# Patient Record
Sex: Female | Born: 1955 | Race: White | Hispanic: No | Smoking: Current every day smoker
Health system: Southern US, Community
[De-identification: ages and names within clinical notes are randomized; demographics above are authoritative.]

## PROBLEM LIST (undated history)

## (undated) DIAGNOSIS — E079 Disorder of thyroid, unspecified: Secondary | ICD-10-CM

## (undated) DIAGNOSIS — F329 Major depressive disorder, single episode, unspecified: Secondary | ICD-10-CM

## (undated) DIAGNOSIS — E039 Hypothyroidism, unspecified: Secondary | ICD-10-CM

## (undated) DIAGNOSIS — I10 Essential (primary) hypertension: Secondary | ICD-10-CM

## (undated) DIAGNOSIS — F419 Anxiety disorder, unspecified: Secondary | ICD-10-CM

## (undated) DIAGNOSIS — F1910 Other psychoactive substance abuse, uncomplicated: Secondary | ICD-10-CM

## (undated) DIAGNOSIS — C801 Malignant (primary) neoplasm, unspecified: Secondary | ICD-10-CM

## (undated) DIAGNOSIS — F32A Depression, unspecified: Secondary | ICD-10-CM

## (undated) DIAGNOSIS — C9000 Multiple myeloma not having achieved remission: Secondary | ICD-10-CM

## (undated) DIAGNOSIS — E785 Hyperlipidemia, unspecified: Secondary | ICD-10-CM

## (undated) HISTORY — DX: Major depressive disorder, single episode, unspecified: F32.9

## (undated) HISTORY — DX: Depression, unspecified: F32.A

## (undated) HISTORY — PX: BACK SURGERY: SHX140

## (undated) HISTORY — DX: Other psychoactive substance abuse, uncomplicated: F19.10

## (undated) HISTORY — DX: Anxiety disorder, unspecified: F41.9

---

## 1998-04-30 ENCOUNTER — Ambulatory Visit (HOSPITAL_COMMUNITY): Admission: RE | Admit: 1998-04-30 | Discharge: 1998-04-30 | Payer: Self-pay | Admitting: Orthopedic Surgery

## 1998-05-14 ENCOUNTER — Ambulatory Visit (HOSPITAL_COMMUNITY): Admission: RE | Admit: 1998-05-14 | Discharge: 1998-05-14 | Payer: Self-pay | Admitting: Orthopedic Surgery

## 1998-05-28 ENCOUNTER — Ambulatory Visit (HOSPITAL_COMMUNITY): Admission: RE | Admit: 1998-05-28 | Discharge: 1998-05-28 | Payer: Self-pay | Admitting: Orthopedic Surgery

## 1998-06-12 ENCOUNTER — Encounter: Payer: Self-pay | Admitting: Neurosurgery

## 1998-06-13 ENCOUNTER — Inpatient Hospital Stay (HOSPITAL_COMMUNITY): Admission: RE | Admit: 1998-06-13 | Discharge: 1998-06-14 | Payer: Self-pay | Admitting: Neurosurgery

## 1998-06-13 ENCOUNTER — Encounter: Payer: Self-pay | Admitting: Neurosurgery

## 1999-11-27 ENCOUNTER — Encounter: Admission: RE | Admit: 1999-11-27 | Discharge: 1999-11-27 | Payer: Self-pay | Admitting: Family Medicine

## 1999-11-27 ENCOUNTER — Encounter: Payer: Self-pay | Admitting: Family Medicine

## 2001-05-05 ENCOUNTER — Other Ambulatory Visit: Admission: RE | Admit: 2001-05-05 | Discharge: 2001-05-05 | Payer: Self-pay | Admitting: Obstetrics and Gynecology

## 2004-03-21 ENCOUNTER — Other Ambulatory Visit: Admission: RE | Admit: 2004-03-21 | Discharge: 2004-03-21 | Payer: Self-pay | Admitting: Obstetrics and Gynecology

## 2005-08-21 ENCOUNTER — Other Ambulatory Visit: Admission: RE | Admit: 2005-08-21 | Discharge: 2005-08-21 | Payer: Self-pay | Admitting: Obstetrics and Gynecology

## 2012-02-21 ENCOUNTER — Emergency Department (HOSPITAL_COMMUNITY): Payer: Managed Care, Other (non HMO)

## 2012-02-21 ENCOUNTER — Emergency Department (HOSPITAL_COMMUNITY)
Admission: EM | Admit: 2012-02-21 | Discharge: 2012-02-21 | Disposition: A | Discharge status: Home / Self Care | Payer: Managed Care, Other (non HMO) | Attending: Emergency Medicine | Admitting: Emergency Medicine

## 2012-02-21 ENCOUNTER — Encounter (HOSPITAL_COMMUNITY): Payer: Self-pay | Admitting: *Deleted

## 2012-02-21 DIAGNOSIS — E079 Disorder of thyroid, unspecified: Secondary | ICD-10-CM | POA: Insufficient documentation

## 2012-02-21 DIAGNOSIS — F410 Panic disorder [episodic paroxysmal anxiety] without agoraphobia: Secondary | ICD-10-CM | POA: Insufficient documentation

## 2012-02-21 DIAGNOSIS — R002 Palpitations: Secondary | ICD-10-CM | POA: Insufficient documentation

## 2012-02-21 HISTORY — DX: Disorder of thyroid, unspecified: E07.9

## 2012-02-21 HISTORY — DX: Essential (primary) hypertension: I10

## 2012-02-21 LAB — DIFFERENTIAL
Basophils Relative: 0 % (ref 0–1)
Eosinophils Relative: 0 % (ref 0–5)
Monocytes Absolute: 0.7 10*3/uL (ref 0.1–1.0)
Monocytes Relative: 8 % (ref 3–12)
Neutro Abs: 5.4 10*3/uL (ref 1.7–7.7)

## 2012-02-21 LAB — CBC
HCT: 38.9 % (ref 36.0–46.0)
Hemoglobin: 13.7 g/dL (ref 12.0–15.0)
MCH: 33.2 pg (ref 26.0–34.0)
MCHC: 35.2 g/dL (ref 30.0–36.0)
MCV: 94.2 fL (ref 78.0–100.0)

## 2012-02-21 LAB — BASIC METABOLIC PANEL
BUN: 13 mg/dL (ref 6–23)
CO2: 24 mEq/L (ref 19–32)
Chloride: 102 mEq/L (ref 96–112)
Creatinine, Ser: 0.65 mg/dL (ref 0.50–1.10)
GFR calc Af Amer: >90 mL/min (ref >90)

## 2012-02-21 MED ORDER — LORAZEPAM 1 MG PO TABS
1.0000 mg | ORAL_TABLET | Freq: Once | ORAL | Status: AC
  Administered 2012-02-21: 1 mg via ORAL
  Filled 2012-02-21: qty 1

## 2012-02-21 MED ORDER — LORAZEPAM 1 MG PO TABS: 1.0000 mg | ORAL_TABLET | Freq: Three times a day (TID) | ORAL | Status: AC | PRN

## 2012-02-21 NOTE — Discharge Instructions (Signed)

## 2012-02-21 NOTE — ED Notes (Signed)
To ED for eval of feeling like her heart is racing. States she isn't feeling normal. Unable to find her thoughts. States she feels delayed. No neuro deficits noted. Denies difficulty with urination. No vomiting. Intermittent HA. States she can feel her heart beating in her throat.

## 2012-02-21 NOTE — ED Provider Notes (Addendum)
History     CSN: [id number]  Arrival date & time 02/21/12  1320   First MD Initiated Contact with Patient 02/21/12 1359      Chief Complaint  Patient presents with  . Palpitations    (Consider location/radiation/quality/duration/timing/severity/associated sxs/prior treatment) Patient is a 56 y.o. female presenting with palpitations. The history is provided by the patient.  Palpitations  This is a recurrent problem. The current episode started 1 to 2 hours ago. The problem occurs constantly. The problem has been gradually improving. The problem is associated with fear and anxiety. Associated symptoms include shortness of breath. Pertinent negatives include no diaphoresis, no fever, no numbness, no chest pain, no exertional chest pressure, no irregular heartbeat, no nausea, no vomiting, no headaches, no leg pain, no weakness and no cough. Associated symptoms comments: States she feels disoriented and palpatations. She has tried nothing for the symptoms. The treatment provided no relief. Risk factors include smoking/tobacco exposure. Her past medical history does not include hyperthyroidism. Past medical history comments: hypothyroidism.    Past Medical History  Diagnosis Date  . Thyroid disease   . Hypertension     Past Surgical History  Procedure Date  . Back surgery     History reviewed. No pertinent family history.  History  Substance Use Topics  . Smoking status: Current Everyday Smoker -- 0.5 packs/day    Types: Cigarettes  . Smokeless tobacco: Not on file  . Alcohol Use: Yes    OB History    Grav Para Term Preterm Abortions TAB SAB Ect Mult Living                  Review of Systems  Constitutional: Negative for fever and diaphoresis.  Respiratory: Positive for shortness of breath. Negative for cough.   Cardiovascular: Positive for palpitations. Negative for chest pain.  Gastrointestinal: Negative for nausea and vomiting.  Neurological: Negative for weakness,  numbness and headaches.  All other systems reviewed and are negative.    Allergies  Review of patient's allergies indicates no known allergies.  Home Medications   Current Outpatient Rx  Name Route Sig Dispense Refill  . CO Q 10 PO Oral Take 1 capsule by mouth every morning.    Marland Kitchen ALERTAB PO Oral Take 1 capsule by mouth daily.    Marland Kitchen ADVIL PM PO Oral Take 2 tablets by mouth at bedtime.    Marland Kitchen LEVOTHYROXINE SODIUM 125 MCG PO TABS Oral Take 125 mcg by mouth daily.    Marland Kitchen METOPROLOL TARTRATE 50 MG PO TABS Oral Take 50 mg by mouth 2 (two) times daily.    Marland Kitchen OVER THE COUNTER MEDICATION Oral Take 1 tablet by mouth daily. Psycles Female Nutrient Support System    . VENLAFAXINE HCL 75 MG PO TABS Oral Take 150 mg by mouth 2 (two) times daily.      BP 143/73  Pulse 77  Temp(Src) 97.9 F (36.6 C) (Oral)  Resp 16  SpO2 100%  Physical Exam  Nursing note and vitals reviewed. Constitutional: She is oriented to person, place, and time. She appears well-developed and well-nourished. She appears distressed.       Tearful on exam  HENT:  Head: Normocephalic and atraumatic.  Mouth/Throat: Oropharynx is clear and moist.  Eyes: Conjunctivae and EOM are normal. Pupils are equal, round, and reactive to light.  Neck: Normal range of motion. Neck supple.  Cardiovascular: Normal rate, regular rhythm and intact distal pulses.   No murmur heard. Pulmonary/Chest: Effort normal and breath sounds  normal. No respiratory distress. She has no wheezes. She has no rales.  Abdominal: Soft. She exhibits no distension. There is no tenderness. There is no rebound and no guarding.  Musculoskeletal: Normal range of motion. She exhibits no edema and no tenderness.  Neurological: She is alert and oriented to person, place, and time.  Skin: Skin is warm and dry. No rash noted. No erythema.  Psychiatric: Her behavior is normal. Her mood appears anxious.    ED Course  Procedures (including critical care time)   Labs  Reviewed  CBC  DIFFERENTIAL  BASIC METABOLIC PANEL  TSH  T4, FREE  LAB REPORT - SCANNED   Dg Chest 2 View  02/21/2012  *RADIOLOGY REPORT*  Clinical Data: Tachycardia.  Smoker.  CHEST - 2 VIEW 02/21/2012:  Comparison: None.  Findings: Cardiomediastinal silhouette unremarkable.  Linear atelectasis or scar in the lingula.  Lungs otherwise clear. Bronchovascular markings normal.  No pleural effusions.  Mild degenerative changes involving the mid thoracic spine.  IMPRESSION: Linear atelectasis or scar in the lingula.  No acute cardiopulmonary disease otherwise.  Original Report Authenticated By: Arnell Sieving, M.D.   Ct Head Wo Contrast  02/21/2012  *RADIOLOGY REPORT*  Clinical Data: Headache, feels strange  CT HEAD WITHOUT CONTRAST  Technique:  Contiguous axial images were obtained from the base of the skull through the vertex without contrast.  Comparison: None.  Findings: The ventricles are normal in size, shape, and position. There is no mass effect or midline shift.  No acute hemorrhage or abnormal extra-axial fluid collections are identified.  The gray/white differentiation is normal.  The orbits, calvarium, visualized paranasal sinuses have a normal appearance.  IMPRESSION: Normal CT scan of the head with no evidence of acute intracranial abnormality.  Original Report Authenticated By: Brandon Melnick, M.D.    Date: 02/21/2012  Rate: 85  Rhythm: normal sinus rhythm  QRS Axis: normal  Intervals: normal  ST/T Wave abnormalities: normal  Conduction Disutrbances: none  Narrative Interpretation: unremarkable      1. Panic attack   2. Palpitations       MDM   The patient with history most consistent with a panic attack today. She felt palpitations, sense of doom, shortness of breath and anxiety.  Patient has had these multiple times but today it was much worse. She states it was so bad she was having difficulty thinking. On exam she is tearful and anxious. She has no focal findings  on exam. She has a normal neurologic exam and normal EKG. She denies any chest pain it was felt palpitations. Her last thyroid check was 8 months ago at which time they did increase her thyroid dose however she's not had hair loss, weight loss, or other symptoms concerning for hyperthyroidism. Patient given Ativan. Head CT, CBC, BMP are all within normal limits. TSH and T4 are pending.  3:40 PM  Patient feeling much better after Ativan we'll discharge home       Gwyneth Sprout, MD 02/21/12 1555  7:18 AM Thyroid studies are wnl.    Gwyneth Sprout, MD 02/22/12 [phone]

## 2013-01-17 ENCOUNTER — Other Ambulatory Visit: Payer: Self-pay

## 2013-01-17 DIAGNOSIS — Z1231 Encounter for screening mammogram for malignant neoplasm of breast: Secondary | ICD-10-CM

## 2013-03-10 ENCOUNTER — Encounter: Payer: Self-pay | Admitting: Obstetrics and Gynecology

## 2013-03-10 ENCOUNTER — Ambulatory Visit (INDEPENDENT_AMBULATORY_CARE_PROVIDER_SITE_OTHER): Payer: BC Managed Care – PPO | Admitting: Obstetrics and Gynecology

## 2013-03-10 ENCOUNTER — Ambulatory Visit
Admission: RE | Admit: 2013-03-10 | Discharge: 2013-03-10 | Disposition: A | Discharge status: Home / Self Care | Payer: BC Managed Care – PPO | Source: Ambulatory Visit

## 2013-03-10 VITALS — BP 122/70 | Ht 65.5 in | Wt 173.0 lb

## 2013-03-10 DIAGNOSIS — Z0000 Encounter for general adult medical examination without abnormal findings: Secondary | ICD-10-CM

## 2013-03-10 DIAGNOSIS — Z01419 Encounter for gynecological examination (general) (routine) without abnormal findings: Secondary | ICD-10-CM

## 2013-03-10 DIAGNOSIS — Z1211 Encounter for screening for malignant neoplasm of colon: Secondary | ICD-10-CM

## 2013-03-10 DIAGNOSIS — Z1231 Encounter for screening mammogram for malignant neoplasm of breast: Secondary | ICD-10-CM

## 2013-03-10 NOTE — Patient Instructions (Signed)

## 2013-03-10 NOTE — Progress Notes (Signed)
Patient ID: Katrina Cunningham, female   DOB: 1956/08/30, 57 y.o.   MRN: [id number] 57 y.o.   Married    Caucasian   female   [id number]   here for annual exam.   States problems with hemorrhoids bleeding today due to okra in diet. Marital discord.  Will start counseling.  Not sexually active currently.  No LMP recorded. Patient is postmenopausal.          Sexually active: no  The current method of family planning is vasectomy.    Exercising: no Last mammogram:  03-10-13: [facility] Last pap smear: 2012 wnl History of abnormal pap: no Smoking: 1pack per week Alcohol: 12-15 glasses of wine per week Last colonoscopy: never Last Bone Density:  never Last tetanus shot: 9 years ago Last cholesterol check: 6 months ago: slightly elevated  Hgb: PCP               Urine:neg   Family History  Problem Relation Age of Onset  . Breast cancer Mother   . Thyroid disease Mother     There are no active problems to display for this patient.   Past Medical History  Diagnosis Date  . Thyroid disease   . Hypertension   . Anxiety   . Depression   . Substance abuse     smokes marijuana daily    Past Surgical History  Procedure Laterality Date  . Back surgery      Allergies: Review of patient's allergies indicates no known allergies.  Current Outpatient Prescriptions  Medication Sig Dispense Refill  . Coenzyme Q10 (CO Q 10 PO) Take 1 capsule by mouth every morning.      . DiphenhydrAMINE HCl (ALERTAB PO) Take 1 capsule by mouth daily.      . Ibuprofen-Diphenhydramine Cit (ADVIL PM PO) Take 2 tablets by mouth at bedtime.      Marland Kitchen levothyroxine (SYNTHROID, LEVOTHROID) 125 MCG tablet Take 125 mcg by mouth daily.      . metoprolol (LOPRESSOR) 50 MG tablet Take 50 mg by mouth 2 (two) times daily.      Marland Kitchen OVER THE COUNTER MEDICATION Take 1 tablet by mouth daily. Psycles Female Nutrient Support System      . venlafaxine (EFFEXOR) 75 MG tablet Take 150 mg by mouth 2 (two) times daily.        No current facility-administered medications for this visit.    ROS: Pertinent items are noted in HPI.  Social Hx:  Married.  Works for [employer]  Exam:    BP 122/70  Ht 5' 5.5" (1.664 m)  Wt 173 lb (78.472 kg)  BMI 28.34 kg/m2   Wt Readings from Last 3 Encounters:  03/10/13 173 lb (78.472 kg)     Ht Readings from Last 3 Encounters:  03/10/13 5' 5.5" (1.664 m)    General appearance: alert, cooperative and appears stated age Head: Normocephalic, without obvious abnormality, atraumatic Neck: no adenopathy, supple, symmetrical, trachea midline and thyroid not enlarged, symmetric, no tenderness/mass/nodules Lungs: clear to auscultation bilaterally Breasts: Inspection negative, No nipple retraction or dimpling, No nipple discharge or bleeding, No axillary or supraclavicular adenopathy, Normal to palpation without dominant masses Heart: regular rate and rhythm Abdomen: soft, non-tender; bowel sounds normal; no masses,  no organomegaly Extremities: extremities normal, atraumatic, no cyanosis or edema Skin: Skin color, texture, turgor normal. No rashes or lesions.  Bruise on left back and central red skin lesion (Looks like pimple).  Patient states she has scratched area extensively. Lymph  nodes: Cervical, supraclavicular, and axillary nodes normal. No abnormal inguinal nodes palpated Neurologic: Grossly normal   Pelvic: External genitalia:  no lesions              Urethra:  normal appearing urethra with no masses, tenderness or lesions              Bartholins and Skenes: normal                 Vagina: normal appearing vagina with normal color and discharge, no lesions.  Petechiae with speculum exam and pap smear.              Cervix: normal appearance              Pap taken: yes and high risk HPV        Bimanual Exam:  Uterus:  uterus is normal size, shape, consistency and nontender                                      Adnexa: normal adnexa in size, nontender and no masses                                       Rectovaginal: Confirms                                      Anus:  Blood stained.  A: normal menopausal exam Bleeding hemorrhoids No prior colonoscopy Atrophic vaginal changes. Heavy ETOH use.       P: mammogram yearly.  Exam done today. pap smear and high risk HPV testing. Declines estrogen Rx for atrophic vaginitis. I discussed ETOH  and marijuana use.  She will consider a trial of stopping.  She has tried an Alcoholics Anonymous meeting in the past but did not feel it was the right specific group for her. Referral made to Dr. Loreta Ave for colonoscopy and care for hemorrhoids. I discussed high fiber diet with the patient to avoid straining against hemorrhoids. return annually or prn     An After Visit Summary was printed and given to the patient.

## 2013-03-11 LAB — POCT URINALYSIS DIPSTICK
Glucose, UA: NEGATIVE
Ketones, UA: NEGATIVE

## 2013-03-16 LAB — IPS PAP TEST WITH HPV

## 2013-03-21 ENCOUNTER — Telehealth: Payer: Self-pay | Admitting: Orthopedic Surgery

## 2013-03-21 NOTE — Telephone Encounter (Signed)
Spoke with pt about appt with Dr. Loreta Ave 03-24-13 at 2:15 for consult for colonoscopy. Pt cannot go then and will call to reschedule. Phone number given.

## 2013-08-25 ENCOUNTER — Other Ambulatory Visit: Payer: Self-pay

## 2014-08-21 ENCOUNTER — Encounter: Payer: Self-pay | Admitting: Obstetrics and Gynecology

## 2014-12-14 ENCOUNTER — Other Ambulatory Visit (HOSPITAL_BASED_OUTPATIENT_CLINIC_OR_DEPARTMENT_OTHER): Payer: Self-pay | Admitting: General Practice

## 2014-12-14 DIAGNOSIS — E038 Other specified hypothyroidism: Secondary | ICD-10-CM

## 2014-12-14 DIAGNOSIS — E039 Hypothyroidism, unspecified: Secondary | ICD-10-CM

## 2014-12-15 ENCOUNTER — Ambulatory Visit (HOSPITAL_BASED_OUTPATIENT_CLINIC_OR_DEPARTMENT_OTHER)
Admission: RE | Admit: 2014-12-15 | Discharge: 2014-12-15 | Disposition: A | Discharge status: Home / Self Care | Payer: Commercial Managed Care - PPO | Source: Ambulatory Visit | Attending: General Practice | Admitting: General Practice

## 2014-12-15 ENCOUNTER — Ambulatory Visit (HOSPITAL_BASED_OUTPATIENT_CLINIC_OR_DEPARTMENT_OTHER): Payer: Commercial Managed Care - PPO

## 2014-12-15 DIAGNOSIS — E039 Hypothyroidism, unspecified: Secondary | ICD-10-CM | POA: Diagnosis present

## 2015-11-01 ENCOUNTER — Other Ambulatory Visit: Payer: Self-pay

## 2015-11-01 DIAGNOSIS — Z1231 Encounter for screening mammogram for malignant neoplasm of breast: Secondary | ICD-10-CM

## 2015-11-22 ENCOUNTER — Ambulatory Visit: Payer: Commercial Managed Care - PPO

## 2015-12-07 ENCOUNTER — Encounter: Payer: Self-pay | Admitting: Obstetrics and Gynecology

## 2015-12-07 ENCOUNTER — Ambulatory Visit: Payer: Self-pay | Admitting: Obstetrics and Gynecology

## 2016-07-29 ENCOUNTER — Encounter: Payer: Self-pay | Admitting: Obstetrics and Gynecology

## 2016-09-15 ENCOUNTER — Ambulatory Visit: Payer: Commercial Managed Care - PPO | Attending: Neurosurgery | Admitting: Physical Therapy

## 2016-09-15 ENCOUNTER — Encounter: Payer: Self-pay | Admitting: Physical Therapy

## 2016-09-15 DIAGNOSIS — G8929 Other chronic pain: Secondary | ICD-10-CM | POA: Insufficient documentation

## 2016-09-15 DIAGNOSIS — M6281 Muscle weakness (generalized): Secondary | ICD-10-CM | POA: Insufficient documentation

## 2016-09-15 DIAGNOSIS — M545 Low back pain: Secondary | ICD-10-CM | POA: Diagnosis present

## 2016-09-16 NOTE — Therapy (Signed)
[facility] [address] [place], [place], [address] Phone: [phone]   Fax:  [phone]  Physical Therapy Evaluation  Patient Details  Name: Katrina Cunningham MRN: [id number] Date of Birth: Aug 12, 1956 Referring Provider: Jovita Gamma, MD  Encounter Date: 09/15/2016      PT End of Session - 09/15/16 2131    Visit Number 1   Date for PT Re-Evaluation 11/10/16   PT Start Time 1532   PT Stop Time 1615   PT Time Calculation (min) 43 min   Activity Tolerance Patient tolerated treatment well   Behavior During Therapy [facility] for tasks assessed/performed      Past Medical History:  Diagnosis Date  . Anxiety   . Depression   . Hypertension   . Substance abuse    smokes marijuana daily  . Thyroid disease     Past Surgical History:  Procedure Laterality Date  . BACK SURGERY      There were no vitals filed for this visit.       Subjective Assessment - 09/15/16 1455    Subjective Pt has had several back surgeries and most recently vertbrea were fused.  States she is wearing brace on the outside of her clothes in order to support herself and also to make sure people know she is injured because she is worried about being bumped into.  States she gained weight since the surgery.  Pt felt better after surgery but got worse after going back to work Nov 6.     Limitations Standing;Lifting;Walking   How long can you stand comfortably? 3 minutes   How long can you walk comfortably? 5 minutes   Patient Stated Goals be able to walk, and "be like I was before", "I feel like I'm afraid"   Currently in Pain? Yes   Pain Score 7    Pain Location Back   Pain Orientation Right;Left;Mid   Pain Descriptors / Indicators Aching;Shooting   Pain Radiating Towards radiates into hips buttock and down the sides of LE   Pain Onset 1 to 4 weeks ago   Multiple Pain Sites No            [facility] PT Assessment - 09/16/16 0001       Assessment   Medical Diagnosis Z98.1   Referring Provider Jovita Gamma, MD   Onset Date/Surgical Date 08/25/16   Next MD Visit 10/05/16   Prior Therapy no     Precautions   Precautions None     Restrictions   Weight Bearing Restrictions No     Balance Screen   Has the patient fallen in the past 6 months No   Has the patient had a decrease in activity level because of a fear of falling?  No   Is the patient reluctant to leave their home because of a fear of falling?  No     Home Ecologist residence     Prior Function   Level of Independence Independent   Vocation Full time employment   Vocation Requirements mostly sitting     Cognition   Overall Cognitive Status Within Functional Limits for tasks assessed     Observation/Other Assessments   Focus on Therapeutic Outcomes (FOTO)  57% limitation  goal: 45% limitation     Sit to Stand   Comments uses UE but able to perform without when cued     Posture/Postural Control   Posture/Postural Control Postural limitations  Postural Limitations Increased lumbar lordosis;Left pelvic obliquity;Flexed trunk   Posture Comments guarded movements     AROM   Overall AROM  Deficits  bilateral hip extension and internal rotation limited 50%     Strength   Overall Strength Comments Core weakness and instability with straight leg raise   Right Hip ADduction 3+/5   Left Hip ADduction 4/5     Slump test   Findings Negative     Straight Leg Raise   Findings Negative     Pelvic Compression   Findings Negative   comment improved SLR test with compression     Sacral thrust    Findings Positive   Side Left     Ambulation/Gait   Gait Pattern Decreased stride length  flexed at hips                             PT Short Term Goals - 09/16/16 0755      PT SHORT TERM GOAL #1   Title pt will demonstrate hip extension to neutral for improved gait mechanics   Time 4    Period Weeks   Status New     PT SHORT TERM GOAL #2   Title pt will be independent with initial HEP   Time 4   Period Weeks   Status New     PT SHORT TERM GOAL #3   Title Pt will perform correct lifting technique to be able to lift 10 lbs from knee height surface without increased pain   Time 4   Period Weeks   Status New           PT Long Term Goals - 09/16/16 0758      PT LONG TERM GOAL #1   Title Pt will be able to return to walking program including walking at least 5 days/week for at least 20 minutes   Baseline not currently walking   Time 8   Period Weeks   Status New     PT LONG TERM GOAL #2   Title FOTO < or = to 45%   Baseline current 57%   Time 8   Period Weeks   Status New     PT LONG TERM GOAL #3   Title pt will be able to safely lift graddaughter with good body mechanics and no increased pain or fear of pain.   Baseline not currently doing due to fear or increased pain   Time 8   Period --   Status New     PT LONG TERM GOAL #4   Title Pt will be able to function a full day at work without increased pain or increased flexed posture at the end of the day   Baseline pt reports increased pain and inability to stand upright at the end of the workday   Time [place] - 09/15/16 2144    Clinical Impression Statement Pt presents to clinic for low complex evaluation due to no comorbidities effecting treatment.  Pt has hip weakness 3+/5 to 4/5 Lt weaker than Rt.  Pt demonstrates core instability and pelvic obliquity.  Pt has flexed posture and hip ROM limited grossly 50%.  Pt demonstrates fear avoidance of functional movements.  Pt will benefit from skilled PT for increased movement in order to reduce pain which is  severe at times 8/10.  Pt will need skilled PT for gradual introduction to movments along with simultaneous strengthening for return to functional activities such as lifting her granddaughter and return  to walking program for exercise.   Rehab Potential Good   PT Frequency 2x / week   PT Duration 4 weeks   PT Treatment/Interventions ADLs/Self Care Home Management;Balance training;Neuromuscular re-education;Patient/family education;Manual techniques;Therapeutic activities;Therapeutic exercise   PT Next Visit Plan hip ROM and stretches, lumbar rotation, initiate core strengthening    PT Home Exercise Plan progress as tolerated   Recommended Other Services none   Consulted and Agree with Plan of Care Patient      Patient will benefit from skilled therapeutic intervention in order to improve the following deficits and impairments:  Abnormal gait, Decreased endurance, Decreased range of motion, Decreased strength, Pain, Improper body mechanics, Decreased activity tolerance, Impaired perceived functional ability, Impaired flexibility, Decreased coordination, Postural dysfunction  Visit Diagnosis: Chronic bilateral low back pain, with sciatica presence unspecified - Plan: PT plan of care cert/re-cert  Muscle weakness (generalized) - Plan: PT plan of care cert/re-cert     Problem List There are no active problems to display for this patient.   Zannie Cove, PT 09/16/2016, 8:23 AM  [facility] [address], [place], [place], [address] Phone: [phone]   Fax:  [phone]  Name: Katrina Cunningham MRN: [id number] Date of Birth: [birth year]

## 2016-09-18 ENCOUNTER — Ambulatory Visit: Payer: Commercial Managed Care - PPO | Admitting: Physical Therapy

## 2016-09-18 ENCOUNTER — Encounter: Payer: Self-pay | Admitting: Physical Therapy

## 2016-09-18 DIAGNOSIS — M6281 Muscle weakness (generalized): Secondary | ICD-10-CM

## 2016-09-18 DIAGNOSIS — G8929 Other chronic pain: Secondary | ICD-10-CM

## 2016-09-18 DIAGNOSIS — M545 Low back pain: Principal | ICD-10-CM

## 2016-09-18 NOTE — Therapy (Addendum)
[facility] [address] [place], [place], [address] Phone: [phone]   Fax:  [phone]  Physical Therapy Treatment  Patient Details  Name: Katrina Cunningham MRN: [id number] Date of Birth: [birth year] Referring Provider: Hosie Spangle, MD  Encounter Date: 09/18/2016      PT End of Session - 09/18/16 1456    Visit Number 2   Date for PT Re-Evaluation 11/10/16   PT Start Time 1452   PT Stop Time 1530   PT Time Calculation (min) 38 min   Activity Tolerance Patient tolerated treatment well   Behavior During Therapy [facility] for tasks assessed/performed      Past Medical History:  Diagnosis Date  . Anxiety   . Depression   . Hypertension   . Substance abuse    smokes marijuana daily  . Thyroid disease     Past Surgical History:  Procedure Laterality Date  . BACK SURGERY      There were no vitals filed for this visit.      Subjective Assessment - 09/18/16 1457    Subjective Pt reports having some back pain today increased with walking.    Limitations Standing;Lifting;Walking   How long can you stand comfortably? 3 minutes   How long can you walk comfortably? 5 minutes   Patient Stated Goals be able to walk, and "be like I was before", "I feel like I'm afraid"   Currently in Pain? Yes   Pain Score 6                          [facility] Adult PT Treatment/Exercise - 09/18/16 0001      Exercises   Exercises Lumbar     Lumbar Exercises: Seated   Sit to Stand 10 reps  Education on body mechanics     Lumbar Exercises: Supine   Ab Set 10 reps  with red ball   Straight Leg Raise 10 reps  Bil 10     Manual Therapy   Manual Therapy Soft tissue mobilization   Manual therapy comments Pt prone   Soft tissue mobilization To Bil lumbar paraspinals                  PT Short Term Goals - 09/16/16 0755      PT SHORT TERM GOAL #1   Title --   Time --   Period --   Status  --     PT SHORT TERM GOAL #2   Title --   Time --   Period --   Status --     PT SHORT TERM GOAL #3   Title --   Time --   Period --   Status --           PT Long Term Goals - 09/16/16 0758      PT LONG TERM GOAL #1   Title --   Baseline --   Time --   Period --   Status --     PT LONG TERM GOAL #2   Title --   Baseline --   Time --   Period --   Status --     PT LONG TERM GOAL #3   Title --   Baseline --   Time --   Period --   Status --     PT LONG TERM GOAL #4   Title --   Baseline --   Time --  Period --   Status --               Plan - 09/18/16 1722    Clinical Impression Statement Pt reports 6/10 back pain today. Pt educated on proper body mechanics and practiced functional squat and log roll technique with therapist. Pt able to tolerate abdominal activation exercises well. Pt has some tenderness in low back with manual therapy. Pt will continue to benefit from skilled therapy for core strength and stability.    Rehab Potential Good   PT Frequency 2x / week   PT Duration 4 weeks   PT Treatment/Interventions ADLs/Self Care Home Management;Balance training;Neuromuscular re-education;Patient/family education;Manual techniques;Therapeutic activities;Therapeutic exercise, electrical stimulation, moist heat, cryotherapy   PT Next Visit Plan hip ROM and stretches, lumbar rotation, core strengthening       Patient will benefit from skilled therapeutic intervention in order to improve the following deficits and impairments:  Abnormal gait, Decreased endurance, Decreased range of motion, Decreased strength, Pain, Improper body mechanics, Decreased activity tolerance, Impaired perceived functional ability, Impaired flexibility, Decreased coordination, Postural dysfunction  Visit Diagnosis: Chronic bilateral low back pain, with sciatica presence unspecified  Muscle weakness (generalized)     Problem List There are no active problems to display  for this patient.   Mikle Bosworth PTA 09/18/2016, 5:25 PM   Zannie Cove, PT 09/23/16 8:08 AM   [facility] [address], [place], [place], [address] Phone: [phone]   Fax:  [phone]  Name: Katrina Cunningham MRN: [id number] Date of Birth: 11-20-1955

## 2016-09-18 NOTE — Patient Instructions (Signed)

## 2016-09-23 NOTE — Addendum Note (Signed)
Addended by: Lovett Calender D on: 09/23/2016 08:10 AM   Modules accepted: Orders

## 2016-09-25 ENCOUNTER — Ambulatory Visit: Payer: Commercial Managed Care - PPO | Attending: Neurosurgery | Admitting: Physical Therapy

## 2016-09-25 ENCOUNTER — Encounter: Payer: Self-pay | Admitting: Physical Therapy

## 2016-09-25 DIAGNOSIS — M545 Low back pain: Secondary | ICD-10-CM | POA: Diagnosis present

## 2016-09-25 DIAGNOSIS — M6281 Muscle weakness (generalized): Secondary | ICD-10-CM | POA: Diagnosis present

## 2016-09-25 DIAGNOSIS — G8929 Other chronic pain: Secondary | ICD-10-CM | POA: Diagnosis present

## 2016-09-25 NOTE — Therapy (Addendum)
[facility] [address] [place], [place], [address] Phone: [phone]   Fax:  [phone]  Physical Therapy Treatment  Patient Details  Name: Katrina Cunningham MRN: [id number] Date of Birth: Nov 05, 1955 Referring Provider: Hosie Spangle, MD  Encounter Date: 09/25/2016      PT End of Session - 09/25/16 1458    Visit Number 3   Date for PT Re-Evaluation 11/10/16   PT Start Time 1450   PT Stop Time 1530   PT Time Calculation (min) 40 min   Activity Tolerance Patient tolerated treatment well   Behavior During Therapy [facility] for tasks assessed/performed      Past Medical History:  Diagnosis Date  . Anxiety   . Depression   . Hypertension   . Substance abuse    smokes marijuana daily  . Thyroid disease     Past Surgical History:  Procedure Laterality Date  . BACK SURGERY      There were no vitals filed for this visit.      Subjective Assessment - 09/25/16 1455    Subjective Pt reports she walked a little yesterday on the treadmill at work.  States she felt flared up after massage last time.  Today she feels like her pain less down the legs and more central in the hips   Limitations Standing;Lifting;Walking   How long can you stand comfortably? 3 minutes   How long can you walk comfortably? 5 minutes   Patient Stated Goals be able to walk, and "be like I was before", "I feel like I'm afraid"   Currently in Pain? Yes   Pain Score 6    Pain Location Hip   Pain Orientation Right;Left   Pain Descriptors / Indicators Aching   Pain Type Chronic pain   Pain Onset More than a month ago   Pain Frequency Intermittent   Aggravating Factors  unsure   Pain Relieving Factors unsure   Effect of Pain on Daily Activities standing up right and turning when working at the kitchen sink - sharp pain causes freezing   Multiple Pain Sites No                         [facility] Adult PT Treatment/Exercise -  09/25/16 0001      Lumbar Exercises: Stretches   Piriformis Stretch 2 reps;20 seconds     Lumbar Exercises: Aerobic   Stationary Bike L1 x 6 minutes     Lumbar Exercises: Machines for Strengthening   Other Lumbar Machine Exercise rows sitting on physioball - 15# 20x     Lumbar Exercises: Standing   Other Standing Lumbar Exercises balance on rocker board both ways, then UE abduction and flexion 10x both ways on rocker board     Lumbar Exercises: Seated   Long Arc Quad on [place] 20 reps   Sit to Stand Limitations seated on physioball - side to side and circles both ways 20x each     Lumbar Exercises: Supine   Ab Set 20 reps  UE extension with yellow band   Other Supine Lumbar Exercises trunk rotation one leg at a time                  PT Short Term Goals - 09/25/16 1734      PT SHORT TERM GOAL #1   Title pt will demonstrate hip extension to neutral for improved gait mechanics   Time 4  Period Weeks   Status On-going     PT SHORT TERM GOAL #2   Title pt will be independent with initial HEP   Time 4   Period Weeks   Status On-going     PT SHORT TERM GOAL #3   Title Pt will perform correct lifting technique to be able to lift 10 lbs from knee height surface without increased pain   Time 4   Period Weeks   Status On-going           PT Long Term Goals - 09/16/16 0758      PT LONG TERM GOAL #1   Title --   Baseline --   Time --   Period --   Status --     PT LONG TERM GOAL #2   Title --   Baseline --   Time --   Period --   Status --     PT LONG TERM GOAL #3   Title --   Baseline --   Time --   Period --   Status --     PT LONG TERM GOAL #4   Title --   Baseline --   Time --   Period --   Status --               Plan - 09/25/16 1735    Clinical Impression Statement Pt demonstrates stiff erect movments, but responded well to gentle pelvic movements on physioball.  Pt tolerated exercises well today.  She continues to have pain  and decreased function and will needs skilled PT for strength and movement deficits   Rehab Potential Good   PT Frequency 2x / week   PT Duration 4 weeks   PT Treatment/Interventions ADLs/Self Care Home Management;Balance training;Neuromuscular re-education;Patient/family education;Manual techniques;Therapeutic activities;Therapeutic exercise;Electrical Stimulation;Cryotherapy;Moist Heat   PT Next Visit Plan coare strengthening, cont lumbar ROM as tolerated, add myofascial release to lumbar region if tolerated   PT Home Exercise Plan progress as tolerated   Consulted and Agree with Plan of Care Patient      Patient will benefit from skilled therapeutic intervention in order to improve the following deficits and impairments:  Abnormal gait, Decreased endurance, Decreased range of motion, Decreased strength, Pain, Improper body mechanics, Decreased activity tolerance, Impaired perceived functional ability, Impaired flexibility, Decreased coordination, Postural dysfunction  Visit Diagnosis: Chronic bilateral low back pain, with sciatica presence unspecified  Muscle weakness (generalized)     Problem List There are no active problems to display for this patient.   Zannie Cove, PT 09/25/2016, 5:40 PM  [facility] [address], [place], [place], [address] Phone: [phone]   Fax:  [phone]  Name: Katrina Cunningham MRN: [id number] Date of Birth: [birth year]  PHYSICAL THERAPY DISCHARGE SUMMARY  Visits from Start of Care: 3  Current functional level related to goals / functional outcomes: See above details   Remaining deficits: See above details   Education / Equipment: HEP  Plan: Patient agrees to discharge.  Patient goals were not met. Patient is being discharged due to not returning since the last visit.  ?????        Google, PT 12/16/16 7:56 AM

## 2016-09-29 ENCOUNTER — Encounter: Payer: Commercial Managed Care - PPO | Admitting: Physical Therapy

## 2016-10-02 ENCOUNTER — Encounter: Payer: Commercial Managed Care - PPO | Admitting: Physical Therapy

## 2016-10-06 ENCOUNTER — Ambulatory Visit: Payer: Commercial Managed Care - PPO | Admitting: Physical Therapy

## 2016-10-09 ENCOUNTER — Encounter: Payer: Commercial Managed Care - PPO | Admitting: Physical Therapy

## 2016-10-16 ENCOUNTER — Encounter: Payer: Commercial Managed Care - PPO | Admitting: Physical Therapy

## 2017-09-16 DIAGNOSIS — E039 Hypothyroidism, unspecified: Secondary | ICD-10-CM | POA: Insufficient documentation

## 2018-02-24 DIAGNOSIS — E785 Hyperlipidemia, unspecified: Secondary | ICD-10-CM | POA: Insufficient documentation

## 2018-02-24 DIAGNOSIS — I10 Essential (primary) hypertension: Secondary | ICD-10-CM | POA: Insufficient documentation

## 2019-04-13 DIAGNOSIS — F321 Major depressive disorder, single episode, moderate: Secondary | ICD-10-CM | POA: Insufficient documentation

## 2019-04-15 DIAGNOSIS — E559 Vitamin D deficiency, unspecified: Secondary | ICD-10-CM | POA: Insufficient documentation

## 2019-07-27 ENCOUNTER — Other Ambulatory Visit: Payer: Self-pay | Admitting: Neurosurgery

## 2019-07-27 DIAGNOSIS — M5416 Radiculopathy, lumbar region: Secondary | ICD-10-CM

## 2019-07-28 ENCOUNTER — Ambulatory Visit
Admission: RE | Admit: 2019-07-28 | Discharge: 2019-07-28 | Disposition: A | Discharge status: Home / Self Care | Payer: BC Managed Care – PPO | Source: Ambulatory Visit | Attending: Neurosurgery | Admitting: Neurosurgery

## 2019-07-28 ENCOUNTER — Other Ambulatory Visit: Payer: Self-pay

## 2019-07-28 DIAGNOSIS — M5416 Radiculopathy, lumbar region: Secondary | ICD-10-CM

## 2019-07-28 MED ORDER — GADOBENATE DIMEGLUMINE 529 MG/ML IV SOLN
16.0000 mL | Freq: Once | INTRAVENOUS | Status: AC | PRN
  Administered 2019-07-28: 16 mL via INTRAVENOUS

## 2019-08-02 ENCOUNTER — Other Ambulatory Visit: Payer: Self-pay | Admitting: Neurosurgery

## 2019-08-04 ENCOUNTER — Other Ambulatory Visit: Payer: Self-pay | Admitting: Neurosurgery

## 2019-08-08 ENCOUNTER — Encounter (HOSPITAL_COMMUNITY): Payer: Self-pay

## 2019-08-08 ENCOUNTER — Encounter (HOSPITAL_COMMUNITY)
Admission: RE | Admit: 2019-08-08 | Discharge: 2019-08-08 | Disposition: A | Discharge status: Home / Self Care | Payer: BC Managed Care – PPO | Source: Ambulatory Visit | Attending: Neurosurgery | Admitting: Neurosurgery

## 2019-08-08 ENCOUNTER — Other Ambulatory Visit: Payer: Self-pay

## 2019-08-08 ENCOUNTER — Other Ambulatory Visit (HOSPITAL_COMMUNITY)
Admission: RE | Admit: 2019-08-08 | Discharge: 2019-08-08 | Disposition: A | Discharge status: Home / Self Care | Payer: BC Managed Care – PPO | Source: Ambulatory Visit | Attending: Neurosurgery | Admitting: Neurosurgery

## 2019-08-08 DIAGNOSIS — Z01812 Encounter for preprocedural laboratory examination: Secondary | ICD-10-CM | POA: Insufficient documentation

## 2019-08-08 DIAGNOSIS — Z01810 Encounter for preprocedural cardiovascular examination: Secondary | ICD-10-CM | POA: Diagnosis not present

## 2019-08-08 HISTORY — DX: Hyperlipidemia, unspecified: E78.5

## 2019-08-08 HISTORY — DX: Hypothyroidism, unspecified: E03.9

## 2019-08-08 LAB — CBC
HCT: 38.2 % (ref 36.0–46.0)
Hemoglobin: 12.6 g/dL (ref 12.0–15.0)
MCH: 34.2 pg — ABNORMAL HIGH (ref 26.0–34.0)
MCHC: 33 g/dL (ref 30.0–36.0)
MCV: 103.8 fL — ABNORMAL HIGH (ref 80.0–100.0)
Platelets: 256 10*3/uL (ref 150–400)
RBC: 3.68 MIL/uL — ABNORMAL LOW (ref 3.87–5.11)
RDW: 11.9 % (ref 11.5–15.5)
WBC: 6.1 10*3/uL (ref 4.0–10.5)
nRBC: 0 % (ref 0.0–0.2)

## 2019-08-08 LAB — COMPREHENSIVE METABOLIC PANEL
ALT: 46 U/L — ABNORMAL HIGH (ref 0–44)
AST: 38 U/L (ref 15–41)
Albumin: 4.2 g/dL (ref 3.5–5.0)
Alkaline Phosphatase: 95 U/L (ref 38–126)
Anion gap: 8 (ref 5–15)
BUN: 12 mg/dL (ref 8–23)
CO2: 23 mmol/L (ref 22–32)
Calcium: 9.3 mg/dL (ref 8.9–10.3)
Chloride: 104 mmol/L (ref 98–111)
Creatinine, Ser: 0.65 mg/dL (ref 0.44–1.00)
GFR calc Af Amer: >60 mL/min (ref >60)
GFR calc non Af Amer: >60 mL/min (ref >60)
Glucose, Bld: 104 mg/dL — ABNORMAL HIGH (ref 70–99)
Potassium: 4 mmol/L (ref 3.5–5.1)
Sodium: 135 mmol/L (ref 135–145)
Total Bilirubin: 0.7 mg/dL (ref 0.3–1.2)
Total Protein: 8.2 g/dL — ABNORMAL HIGH (ref 6.5–8.1)

## 2019-08-08 LAB — SURGICAL PCR SCREEN
MRSA, PCR: NEGATIVE
Staphylococcus aureus: POSITIVE — AB

## 2019-08-08 LAB — SARS CORONAVIRUS 2 (TAT 6-24 HRS): SARS Coronavirus 2: NEGATIVE

## 2019-08-08 NOTE — Progress Notes (Signed)
[facility] - [place] - [address] [place] [address] Phone: [phone] Fax: [phone]  [place] ([address] [place] [id number] [place] ([place]) [place] [address] Phone: [phone] Fax: [phone]      Your procedure is scheduled on Wednesday, 10/21.  Report to [facility] Main Entrance "A" at 8 A.M., and check in at the Admitting office.  Call this number if you have problems the morning of surgery:  [phone]  Call [phone] if you have any questions prior to your surgery date Monday-Friday 8am-4pm    Remember:  Do not eat or drink after midnight the night before your surgery - Tuesday   Take these medicines the morning of surgery with A SIP OF WATER : amLODipine (NORVASC) atorvastatin (LIPITOR)  levothyroxine (SYNTHROID metoprolol (LOPRESSOR) venlafaxine [facility])   STOP now taking any Aspirin (unless otherwise instructed by your surgeon), Aleve, Naproxen, Ibuprofen, Motrin, Advil, Goody's, BC's, all herbal medications, fish oil, and all vitamins.    The Morning of Surgery  Do not wear jewelry, make-up or nail polish.  Do not wear lotions, powders, perfumes or deodorant  Do not shave 48 hours prior to surgery.  .  Do not bring valuables to the hospital.  [facility] is not responsible for any belongings or valuables.  If you are a smoker, DO NOT Smoke 24 hours prior to surgery IF you wear a CPAP at night please bring your mask, tubing, and machine the morning of surgery   Remember that you must have someone to transport you home after your surgery, and remain with you for 24 hours if you are discharged the same day.   Contacts, glasses, hearing aids, dentures or bridgework may not be worn into surgery.   Leave your suitcase in the car.  After surgery it may be brought to your room.  For patients admitted to the hospital, discharge time will  be determined by your treatment team.  Special instructions:   [place]- Preparing For Surgery  Before surgery, you can play an important role. Because skin is not sterile, your skin needs to be as free of germs as possible. You can reduce the number of germs on your skin by washing with CHG (chlorahexidine gluconate) Soap before surgery.  CHG is an antiseptic cleaner which kills germs and bonds with the skin to continue killing germs even after washing.    Oral Hygiene is also important to reduce your risk of infection.  Remember - BRUSH YOUR TEETH THE MORNING OF SURGERY WITH YOUR REGULAR TOOTHPASTE  Please do not use if you have an allergy to CHG or antibacterial soaps. If your skin becomes reddened/irritated stop using the CHG.  Do not shave (including legs and underarms) for at least 48 hours prior to first CHG shower. It is OK to shave your face.  Please follow these instructions carefully.   1. Shower the NIGHT BEFORE SURGERY and the MORNING OF SURGERY with CHG Soap.   2. If you chose to wash your hair, wash your hair first as usual with your normal shampoo.  3. After you shampoo, rinse your hair and body thoroughly to remove the shampoo.  4. Use CHG as you would any other liquid soap. You can apply CHG directly to the skin and wash gently with a scrungie or a clean washcloth.   5. Apply the CHG Soap to your body ONLY FROM THE NECK  DOWN.  Do not use on open wounds or open sores. Avoid contact with your eyes, ears, mouth and genitals (private parts). Wash Face and genitals (private parts)  with your normal soap.   6. Wash thoroughly, paying special attention to the area where your surgery will be performed.  7. Thoroughly rinse your body with warm water from the neck down.  8. DO NOT shower/wash with your normal soap after using and rinsing off the CHG Soap.  9. Pat yourself dry with a CLEAN TOWEL.  10. Wear CLEAN PAJAMAS to bed the night before surgery, wear comfortable  clothes the morning of surgery  11. Place CLEAN SHEETS on your bed the night of your first shower and DO NOT SLEEP WITH PETS.    Day of Surgery:  Do not apply any deodorants/lotions. Please shower the morning of surgery with the CHG soap  Please wear clean clothes to the hospital/surgery center.   Remember to brush your teeth WITH YOUR REGULAR TOOTHPASTE.   Please read over the following fact sheets that you were given.

## 2019-08-08 NOTE — Progress Notes (Signed)
Patient denies shortness of breath, fever, cough and chest pain.  PCP - Harrison Mons, PA-C Cardiologist - Denies Endocrine - Dr Francetta Found  Chest x-ray - denies EKG - 08/08/19 Stress Test - denies ECHO - denies Cardiac Cath - denies  Anesthesia review: Yes.  ETOH and marijuana use daily.  Coronavirus Screening Have you or experienced the following symptoms:  Cough yes/no: No Fever (>100.12F)  yes/no: No Runny nose yes/no: No Sore throat yes/no: No Difficulty breathing/shortness of breath  yes/no: No  Have you or  traveled in the last 14 days and where? yes/no: No  Covid test 08/08/19 pending.

## 2019-08-09 NOTE — Anesthesia Preprocedure Evaluation (Addendum)
Anesthesia Evaluation  Patient identified by MRN, date of birth, ID band Patient awake    Reviewed: Allergy & Precautions, NPO status , Patient's Chart, lab work & pertinent test results  History of Anesthesia Complications Negative for: history of anesthetic complications  Airway Mallampati: III  TM Distance: <3 FB Neck ROM: Full    Dental  (+) Dental Advisory Given   Pulmonary Current Smoker and Patient abstained from smoking.,    breath sounds clear to auscultation       Cardiovascular hypertension, Pt. on medications  Rhythm:Regular     Neuro/Psych PSYCHIATRIC DISORDERS Anxiety Depression negative neurological ROS     GI/Hepatic negative GI ROS, (+)     substance abuse  marijuana use,   Endo/Other  Hypothyroidism   Renal/GU negative Renal ROS     Musculoskeletal negative musculoskeletal ROS (+)   Abdominal   Peds  Hematology negative hematology ROS (+)   Anesthesia Other Findings   Reproductive/Obstetrics                           Anesthesia Physical Anesthesia Plan  ASA: II  Anesthesia Plan: General   Post-op Pain Management:    Induction: Intravenous  PONV Risk Score and Plan: 2 and Ondansetron and Dexamethasone  Airway Management Planned: Oral ETT  Additional Equipment: None  Intra-op Plan:   Post-operative Plan: Extubation in OR  Informed Consent: I have reviewed the patients History and Physical, chart, labs and discussed the procedure including the risks, benefits and alternatives for the proposed anesthesia with the patient or authorized representative who has indicated his/her understanding and acceptance.     Dental advisory given  Plan Discussed with: CRNA and Surgeon  Anesthesia Plan Comments: (Per PAT nurse, pt reports daily marijuana and ETOH use. Preop labs reviewed, unremarkable. Preop EKG shows NSR, rate 67.)       Anesthesia Quick  Evaluation

## 2019-08-10 ENCOUNTER — Ambulatory Visit (HOSPITAL_COMMUNITY)
Admission: RE | Admit: 2019-08-10 | Discharge: 2019-08-10 | Disposition: A | Discharge status: Home / Self Care | Payer: BC Managed Care – PPO | Attending: Neurosurgery | Admitting: Neurosurgery

## 2019-08-10 ENCOUNTER — Encounter (HOSPITAL_COMMUNITY): Payer: Self-pay | Admitting: *Deleted

## 2019-08-10 ENCOUNTER — Encounter (HOSPITAL_COMMUNITY)
Admission: RE | Disposition: A | Discharge status: Home / Self Care | Payer: Self-pay | Source: Home / Self Care | Attending: Neurosurgery

## 2019-08-10 ENCOUNTER — Ambulatory Visit (HOSPITAL_COMMUNITY): Payer: BC Managed Care – PPO | Admitting: Anesthesiology

## 2019-08-10 ENCOUNTER — Other Ambulatory Visit: Payer: Self-pay

## 2019-08-10 ENCOUNTER — Ambulatory Visit (HOSPITAL_COMMUNITY): Payer: BC Managed Care – PPO | Admitting: Physician Assistant

## 2019-08-10 ENCOUNTER — Ambulatory Visit (HOSPITAL_COMMUNITY): Payer: BC Managed Care – PPO

## 2019-08-10 DIAGNOSIS — F17210 Nicotine dependence, cigarettes, uncomplicated: Secondary | ICD-10-CM | POA: Diagnosis not present

## 2019-08-10 DIAGNOSIS — Z791 Long term (current) use of non-steroidal anti-inflammatories (NSAID): Secondary | ICD-10-CM | POA: Diagnosis not present

## 2019-08-10 DIAGNOSIS — M4856XA Collapsed vertebra, not elsewhere classified, lumbar region, initial encounter for fracture: Secondary | ICD-10-CM | POA: Insufficient documentation

## 2019-08-10 DIAGNOSIS — F419 Anxiety disorder, unspecified: Secondary | ICD-10-CM | POA: Diagnosis not present

## 2019-08-10 DIAGNOSIS — I10 Essential (primary) hypertension: Secondary | ICD-10-CM | POA: Diagnosis not present

## 2019-08-10 DIAGNOSIS — Z79899 Other long term (current) drug therapy: Secondary | ICD-10-CM | POA: Insufficient documentation

## 2019-08-10 DIAGNOSIS — E785 Hyperlipidemia, unspecified: Secondary | ICD-10-CM | POA: Insufficient documentation

## 2019-08-10 DIAGNOSIS — F329 Major depressive disorder, single episode, unspecified: Secondary | ICD-10-CM | POA: Insufficient documentation

## 2019-08-10 DIAGNOSIS — E039 Hypothyroidism, unspecified: Secondary | ICD-10-CM | POA: Diagnosis not present

## 2019-08-10 DIAGNOSIS — Z79890 Hormone replacement therapy (postmenopausal): Secondary | ICD-10-CM | POA: Diagnosis not present

## 2019-08-10 DIAGNOSIS — Z419 Encounter for procedure for purposes other than remedying health state, unspecified: Secondary | ICD-10-CM

## 2019-08-10 HISTORY — PX: KYPHOPLASTY: SHX5884

## 2019-08-10 SURGERY — KYPHOPLASTY
Anesthesia: General | Site: Back

## 2019-08-10 MED ORDER — LIDOCAINE 2% (20 MG/ML) 5 ML SYRINGE
INTRAMUSCULAR | Status: DC | PRN
  Administered 2019-08-10: 40 mg via INTRAVENOUS

## 2019-08-10 MED ORDER — PHENYLEPHRINE 40 MCG/ML (10ML) SYRINGE FOR IV PUSH (FOR BLOOD PRESSURE SUPPORT)
PREFILLED_SYRINGE | INTRAVENOUS | Status: DC | PRN
  Administered 2019-08-10 (×5): 40 ug via INTRAVENOUS

## 2019-08-10 MED ORDER — CEFAZOLIN SODIUM-DEXTROSE 2-4 GM/100ML-% IV SOLN
2.0000 g | INTRAVENOUS | Status: AC
  Administered 2019-08-10: 2 g via INTRAVENOUS

## 2019-08-10 MED ORDER — KETOROLAC TROMETHAMINE 30 MG/ML IJ SOLN
INTRAMUSCULAR | Status: AC
  Filled 2019-08-10: qty 1

## 2019-08-10 MED ORDER — IOPAMIDOL (ISOVUE-300) INJECTION 61%
INTRAVENOUS | Status: DC | PRN
  Administered 2019-08-10: 50 mL

## 2019-08-10 MED ORDER — LIDOCAINE 2% (20 MG/ML) 5 ML SYRINGE
INTRAMUSCULAR | Status: AC
  Filled 2019-08-10: qty 5

## 2019-08-10 MED ORDER — ROCURONIUM BROMIDE 10 MG/ML (PF) SYRINGE
PREFILLED_SYRINGE | INTRAVENOUS | Status: DC | PRN
  Administered 2019-08-10: 10 mg via INTRAVENOUS
  Administered 2019-08-10: 60 mg via INTRAVENOUS
  Administered 2019-08-10: 10 mg via INTRAVENOUS

## 2019-08-10 MED ORDER — SUGAMMADEX SODIUM 200 MG/2ML IV SOLN
INTRAVENOUS | Status: DC | PRN
  Administered 2019-08-10: 200 mg via INTRAVENOUS

## 2019-08-10 MED ORDER — 0.9 % SODIUM CHLORIDE (POUR BTL) OPTIME
TOPICAL | Status: DC | PRN
  Administered 2019-08-10: 1000 mL

## 2019-08-10 MED ORDER — ACETAMINOPHEN 160 MG/5ML PO SOLN: 1000.0000 mg | Freq: Once | ORAL | Status: DC | PRN

## 2019-08-10 MED ORDER — ACETAMINOPHEN 500 MG PO TABS: 1000.0000 mg | ORAL_TABLET | Freq: Once | ORAL | Status: DC | PRN

## 2019-08-10 MED ORDER — DEXAMETHASONE SODIUM PHOSPHATE 10 MG/ML IJ SOLN
INTRAMUSCULAR | Status: AC
  Filled 2019-08-10: qty 1

## 2019-08-10 MED ORDER — OXYCODONE HCL 5 MG PO TABS: 5.0000 mg | ORAL_TABLET | Freq: Once | ORAL | Status: DC | PRN

## 2019-08-10 MED ORDER — BUPIVACAINE HCL (PF) 0.5 % IJ SOLN
INTRAMUSCULAR | Status: DC | PRN
  Administered 2019-08-10: 10 mL

## 2019-08-10 MED ORDER — FENTANYL CITRATE (PF) 250 MCG/5ML IJ SOLN
INTRAMUSCULAR | Status: DC | PRN
  Administered 2019-08-10 (×3): 50 ug via INTRAVENOUS

## 2019-08-10 MED ORDER — LIDOCAINE-EPINEPHRINE 1 %-1:100000 IJ SOLN
INTRAMUSCULAR | Status: AC
  Filled 2019-08-10: qty 1

## 2019-08-10 MED ORDER — LIDOCAINE-EPINEPHRINE 1 %-1:100000 IJ SOLN
INTRAMUSCULAR | Status: DC | PRN
  Administered 2019-08-10: 10 mL

## 2019-08-10 MED ORDER — ONDANSETRON HCL 4 MG/2ML IJ SOLN
INTRAMUSCULAR | Status: DC | PRN
  Administered 2019-08-10: 4 mg via INTRAVENOUS

## 2019-08-10 MED ORDER — FENTANYL CITRATE (PF) 250 MCG/5ML IJ SOLN
INTRAMUSCULAR | Status: AC
  Filled 2019-08-10: qty 5

## 2019-08-10 MED ORDER — BUPIVACAINE HCL (PF) 0.5 % IJ SOLN
INTRAMUSCULAR | Status: AC
  Filled 2019-08-10: qty 30

## 2019-08-10 MED ORDER — CHLORHEXIDINE GLUCONATE CLOTH 2 % EX PADS: 6.0000 | MEDICATED_PAD | Freq: Once | CUTANEOUS | Status: DC

## 2019-08-10 MED ORDER — ONDANSETRON HCL 4 MG/2ML IJ SOLN
INTRAMUSCULAR | Status: AC
  Filled 2019-08-10: qty 4

## 2019-08-10 MED ORDER — MIDAZOLAM HCL 2 MG/2ML IJ SOLN
INTRAMUSCULAR | Status: DC | PRN
  Administered 2019-08-10: 2 mg via INTRAVENOUS

## 2019-08-10 MED ORDER — KETOROLAC TROMETHAMINE 30 MG/ML IJ SOLN
30.0000 mg | Freq: Once | INTRAMUSCULAR | Status: AC
  Administered 2019-08-10: 30 mg via INTRAVENOUS

## 2019-08-10 MED ORDER — FENTANYL CITRATE (PF) 100 MCG/2ML IJ SOLN: 25.0000 ug | INTRAMUSCULAR | Status: DC | PRN

## 2019-08-10 MED ORDER — ROCURONIUM BROMIDE 10 MG/ML (PF) SYRINGE
PREFILLED_SYRINGE | INTRAVENOUS | Status: AC
  Filled 2019-08-10: qty 10

## 2019-08-10 MED ORDER — PROPOFOL 10 MG/ML IV BOLUS
INTRAVENOUS | Status: DC | PRN
  Administered 2019-08-10: 150 mg via INTRAVENOUS

## 2019-08-10 MED ORDER — CEFAZOLIN SODIUM-DEXTROSE 2-4 GM/100ML-% IV SOLN
INTRAVENOUS | Status: AC
  Filled 2019-08-10: qty 100

## 2019-08-10 MED ORDER — PHENYLEPHRINE 40 MCG/ML (10ML) SYRINGE FOR IV PUSH (FOR BLOOD PRESSURE SUPPORT)
PREFILLED_SYRINGE | INTRAVENOUS | Status: AC
  Filled 2019-08-10: qty 10

## 2019-08-10 MED ORDER — OXYCODONE HCL 5 MG/5ML PO SOLN: 5.0000 mg | Freq: Once | ORAL | Status: DC | PRN

## 2019-08-10 MED ORDER — LACTATED RINGERS IV SOLN
INTRAVENOUS | Status: DC
  Administered 2019-08-10 (×2): via INTRAVENOUS

## 2019-08-10 MED ORDER — MIDAZOLAM HCL 2 MG/2ML IJ SOLN
INTRAMUSCULAR | Status: AC
  Filled 2019-08-10: qty 2

## 2019-08-10 MED ORDER — ACETAMINOPHEN 10 MG/ML IV SOLN
INTRAVENOUS | Status: AC
  Filled 2019-08-10: qty 100

## 2019-08-10 MED ORDER — ACETAMINOPHEN 10 MG/ML IV SOLN
1000.0000 mg | Freq: Once | INTRAVENOUS | Status: DC | PRN
  Administered 2019-08-10: 1000 mg via INTRAVENOUS

## 2019-08-10 MED ORDER — DEXAMETHASONE SODIUM PHOSPHATE 10 MG/ML IJ SOLN
INTRAMUSCULAR | Status: DC | PRN
  Administered 2019-08-10: 5 mg via INTRAVENOUS

## 2019-08-10 SURGICAL SUPPLY — 48 items
BLADE CLIPPER SURG (BLADE) IMPLANT
BLADE SURG 11 STRL SS (BLADE) ×2 IMPLANT
BNDG ADH 1X3 SHEER STRL LF (GAUZE/BANDAGES/DRESSINGS) ×4 IMPLANT
CARTRIDGE OIL MAESTRO DRILL (MISCELLANEOUS) ×1 IMPLANT
CEMENT KYPHON C01A KIT/MIXER (Cement) ×1 IMPLANT
CONT SPEC 4OZ CLIKSEAL STRL BL (MISCELLANEOUS) ×3 IMPLANT
COVER WAND RF STERILE (DRAPES) ×1 IMPLANT
DECANTER SPIKE VIAL GLASS SM (MISCELLANEOUS) ×2 IMPLANT
DERMABOND ADVANCED (GAUZE/BANDAGES/DRESSINGS) ×1
DERMABOND ADVANCED .7 DNX12 (GAUZE/BANDAGES/DRESSINGS) IMPLANT
DEVICE BIOPSY BONE KYPHX (INSTRUMENTS) ×1 IMPLANT
DIFFUSER DRILL AIR PNEUMATIC (MISCELLANEOUS) ×1 IMPLANT
DRAPE C-ARM 42X72 X-RAY (DRAPES) ×2 IMPLANT
DRAPE HALF SHEET 40X57 (DRAPES) ×2 IMPLANT
DRAPE INCISE IOBAN 66X45 STRL (DRAPES) ×2 IMPLANT
DRAPE LAPAROTOMY 100X72X124 (DRAPES) ×2 IMPLANT
DRAPE WARM FLUID 44X44 (DRAPES) ×2 IMPLANT
DURAPREP 26ML APPLICATOR (WOUND CARE) ×3 IMPLANT
GAUZE 4X4 16PLY RFD (DISPOSABLE) ×2 IMPLANT
GLOVE BIOGEL PI IND STRL 6.5 (GLOVE) IMPLANT
GLOVE BIOGEL PI IND STRL 8 (GLOVE) ×1 IMPLANT
GLOVE BIOGEL PI INDICATOR 6.5 (GLOVE) ×1
GLOVE BIOGEL PI INDICATOR 8 (GLOVE) ×3
GLOVE ECLIPSE 6.5 STRL STRAW (GLOVE) ×1 IMPLANT
GLOVE ECLIPSE 7.5 STRL STRAW (GLOVE) ×2 IMPLANT
GLOVE EXAM NITRILE XL STR (GLOVE) IMPLANT
GOWN STRL REUS W/ TWL LRG LVL3 (GOWN DISPOSABLE) IMPLANT
GOWN STRL REUS W/ TWL XL LVL3 (GOWN DISPOSABLE) IMPLANT
GOWN STRL REUS W/TWL 2XL LVL3 (GOWN DISPOSABLE) ×1 IMPLANT
GOWN STRL REUS W/TWL LRG LVL3 (GOWN DISPOSABLE) ×1
GOWN STRL REUS W/TWL XL LVL3 (GOWN DISPOSABLE) ×1
KIT BASIN OR (CUSTOM PROCEDURE TRAY) ×2 IMPLANT
KIT TURNOVER KIT B (KITS) ×2 IMPLANT
NDL HYPO 25X1 1.5 SAFETY (NEEDLE) ×1 IMPLANT
NEEDLE HYPO 25X1 1.5 SAFETY (NEEDLE) ×2 IMPLANT
NS IRRIG 1000ML POUR BTL (IV SOLUTION) ×2 IMPLANT
OIL CARTRIDGE MAESTRO DRILL (MISCELLANEOUS)
PACK SURGICAL SETUP 50X90 (CUSTOM PROCEDURE TRAY) ×2 IMPLANT
PAD ARMBOARD 7.5X6 YLW CONV (MISCELLANEOUS) ×6 IMPLANT
SPECIMEN JAR SMALL (MISCELLANEOUS) IMPLANT
SUT VIC AB 3-0 SH 8-18 (SUTURE) ×2 IMPLANT
SUT VIC AB 4-0 P-3 18X BRD (SUTURE) ×1 IMPLANT
SUT VIC AB 4-0 P3 18 (SUTURE) ×1
SUT VIC AB 4-0 RB1 18 (SUTURE) ×1 IMPLANT
SYR CONTROL 10ML LL (SYRINGE) ×4 IMPLANT
TOWEL GREEN STERILE (TOWEL DISPOSABLE) ×2 IMPLANT
TOWEL GREEN STERILE FF (TOWEL DISPOSABLE) ×1 IMPLANT
TRAY KYPHOPAK 20/3 ONESTEP 1ST (MISCELLANEOUS) ×1 IMPLANT

## 2019-08-10 NOTE — H&P (Signed)
Subjective: Patient is a 63 y.o. female who is admitted for treatment of L1 wedge compression fracture, suspected to be a pathologic fracture.  Patient developed pain in her back, rating into the right lower extremity in mid September.  There is no specific incident or trauma.  Work-up revealed a L1 wedge compression fracture with increased signal and enhancement through the entire L1 vertebra and extending back into the pedicles, as well as areas of enhancement in the L3 and L5 vertebra.  The overall picture is worrisome for malignancy.  Patient is admitted now for a L1 vertebral body biopsy and kyphoplasty.   Past Medical History:  Diagnosis Date  . Anxiety   . Depression   . Hyperlipidemia   . Hypertension   . Hypothyroidism   . Substance abuse ([place])    smokes marijuana daily  . Thyroid disease     Past Surgical History:  Procedure Laterality Date  . BACK SURGERY      Medications Prior to Admission  Medication Sig Dispense Refill Last Dose  . amLODipine (NORVASC) 2.5 MG tablet Take 2.5 mg by mouth daily.   08/09/2019 at Unknown time  . atorvastatin (LIPITOR) 20 MG tablet Take 20 mg by mouth daily.   08/09/2019 at Unknown time  . diclofenac (VOLTAREN) 75 MG EC tablet Take 75 mg by mouth 2 (two) times daily.   Past Week at Unknown time  . levothyroxine (SYNTHROID, LEVOTHROID) 125 MCG tablet Take 125 mcg by mouth daily.   08/10/2019 at Unknown time  . metoprolol (LOPRESSOR) 50 MG tablet Take 50 mg by mouth 2 (two) times daily.   08/10/2019 at Unknown time  . mirtazapine (REMERON) 15 MG tablet Take 15 mg by mouth at bedtime.   08/09/2019 at Unknown time  . OVER THE COUNTER MEDICATION Take 1 Scoop by mouth daily. Vital Reds Probiotic & Prebiotic     . venlafaxine (EFFEXOR) 75 MG tablet Take 150 mg by mouth 2 (two) times daily.   08/10/2019 at Unknown time   No Known Allergies  Social History   Tobacco Use  . Smoking status: Current Every Day Smoker    Packs/day: 0.15    Years: 20.00     Pack years: 3.00    Types: Cigarettes  . Smokeless tobacco: Never Used  Substance Use Topics  . Alcohol use: Yes    Comment: 30 glasses of wine per week    Family History  Problem Relation Age of Onset  . Breast cancer Mother   . Thyroid disease Mother      Review of Systems Pertinent items noted in HPI and remainder of comprehensive ROS otherwise negative.  Objective: Vital signs in last 24 hours: Temp:  [98 F (36.7 C)] 98 F (36.7 C) (10/21 0818) Pulse Rate:  [79] 79 (10/21 0818) Resp:  [20] 20 (10/21 0818) BP: (156)/(87) 156/87 (10/21 0818) SpO2:  [98 %] 98 % (10/21 0818) Weight:  [79.8 kg] 79.8 kg (10/21 0818)  EXAM: Patient is a well-developed well-nourished white female in no acute distress.   Lungs are clear to auscultation , the patient has symmetrical respiratory excursion. Heart has a regular rate and rhythm normal S1 and S2 no murmur.   Abdomen is soft nontender nondistended bowel sounds are present. Extremity examination shows no clubbing cyanosis or edema. Motor examination shows 5 over 5 strength in the lower extremities including the iliopsoas quadriceps dorsiflexor extensor hallicus  longus and plantar flexor bilaterally. Sensation is intact to pinprick in the distal lower extremities.  Reflex examination shows the left quadriceps is 2, right quadriceps is 1, left gastrocnemius is 2, right gastrocnemius is absent.  Toes are downgoing bilaterally.. No pathologic reflexes are present.  Gait and stance favor the right lower extremity.  Data Review:CBC    Component Value Date/Time   WBC 6.1 08/08/2019 1443   RBC 3.68 (L) 08/08/2019 1443   HGB 12.6 08/08/2019 1443   HCT 38.2 08/08/2019 1443   PLT 256 08/08/2019 1443   MCV 103.8 (H) 08/08/2019 1443   MCH 34.2 (H) 08/08/2019 1443   MCHC 33.0 08/08/2019 1443   RDW 11.9 08/08/2019 1443   LYMPHSABS 2.7 02/21/2012 1436   MONOABS 0.7 02/21/2012 1436   EOSABS 0.0 02/21/2012 1436   BASOSABS 0.0 02/21/2012 1436                           BMET    Component Value Date/Time   NA 135 08/08/2019 1443   K 4.0 08/08/2019 1443   CL 104 08/08/2019 1443   CO2 23 08/08/2019 1443   GLUCOSE 104 (H) 08/08/2019 1443   BUN 12 08/08/2019 1443   CREATININE 0.65 08/08/2019 1443   CALCIUM 9.3 08/08/2019 1443   GFRNONAA >60 08/08/2019 1443   GFRAA >60 08/08/2019 1443     Assessment/Plan: Patient with L1 wedge compression fracture suspicious for pathologic fracture with lesions in L3 and L5 as well.  She is being admitted for L1 vertebral body biopsy and kyphoplasty.  I've discussed with the patient the nature of his condition, the nature the surgical procedure, the typical length of surgery, and overall recuperation. We discussed limitations postoperatively. I discussed risks of surgery including risks of infection, bleeding, possibly need for transfusion, the risk of nerve root dysfunction with pain, weakness, numbness, or paresthesias, or risk of dural tear and CSF leakage and possible need for further surgery, and the risk of anesthetic complications including myocardial infarction, stroke, pneumonia, and death. Understanding all this the patient does wish to proceed with surgery and is admitted for such.  Hosie Spangle, MD 08/10/2019 9:55 AM

## 2019-08-10 NOTE — Op Note (Signed)
08/10/2019  11:16 AM  PATIENT:  Katrina Cunningham  63 y.o. female  PRE-OPERATIVE DIAGNOSIS: L1 wedge compression fracture (probably pathologic fracture), low back pain, lumbar radiculopathy  POST-OPERATIVE DIAGNOSIS:  L1 wedge compression fracture (probably pathologic fracture), low back pain, lumbar radiculopathy  PROCEDURE:  Procedure(s): L1 vertebral body biopsy, L1 kyphoplasty  SURGEON:  Surgeon(s): Jovita Gamma, MD  ANESTHESIA:   general  EBL:  Total I/O In: 1000 [I.V.:1000] Out: 25 [Blood:25]  BLOOD ADMINISTERED:none  COUNT: Correct per nursing staff  SPECIMEN:  Source of Specimen:  L1 vertebral body core biopsy, L1 vertebral body aspiration  DICTATION: Patient was brought to the operating room, placed under general endotracheal anesthesia. AP and lateral C-arm fluoroscopy units were set up, and the L1 vertebra identified. The thoracolumbar region posteriorly was prepped with DuraPrep, and draped in a sterile fashion. The C-arm fluoroscopy units were also draped in a sterile fashion. The entry points to approach the posterior lateral aspect of the L1 pedicles were identified bilaterally.  The skin and subcutaneous tissue were infiltrated with local anesthetic with epinephrine. 3 mm incisions were made on either side at the entry points. The cannulated trocar was passed down to the posterior lateral entry point of the pedicles, on each side. Each trocar was passed through the pedicle with C-arm fluoroscopic guidance into the posterior aspect of the vertebral body on either side. We then removed the inner trocar, leaving the outer cannula.  The biopsy tool was then used to core through the L1 vertebral body.  On the right side, a good core was obtained.however on the left side, no significant specimen was obtained.  The core specimen was placed in saline and sent to pathology for permanent examination.  We then aspirated from the vertebral body on each side, and that specimen was  sent separately for permanent pathology examination.  Then using the drill we created a passage through the vertebral body, to the ventral wall on each side. The balloon expanders were placed in the vertebral body on either side, and gently inflated and expanded under C-arm fluoroscopic guidance. Once good compaction was achieved bilaterally, the balloon expanders were deflated, and we began to fill the cavities that had been created with methylmethacrylate. We filled from one side to the other, using C-arm fluoroscopic guidance, until good filling of the cavity, and good interdigitation with the bone was achieved bilaterally. A total of 7.5 cc of methylmethacrylate was introduced. We then carefully removed each of the cannulas, using C-arm for guidance. Final imaging showed good filling of the cavity, with no extravasation and good bony interdigitation. Each of the incisions was closed with a single 4-0 undyed Vicryl subcuticular suture, and Dermabond. Following surgery the patient was turned back to a supine position, to be reversed and the anesthetic, extubated, and transferred to the recovery room for further care.   PLAN OF CARE: Patient to be assessed in the PACU, discharged to home if feasible, otherwise admitted for observation.  PATIENT DISPOSITION:  PACU - hemodynamically stable.   Delay start of Pharmacological VTE agent (>24hrs) due to surgical blood loss or risk of bleeding:  yes

## 2019-08-10 NOTE — Transfer of Care (Signed)
Immediate Anesthesia Transfer of Care Note  Patient: Katrina Cunningham  Procedure(s) Performed: KYPHOPLASTY LUMBAR ONE WITH BIOPSY (N/A Back)  Patient Location: PACU  Anesthesia Type:General  Level of Consciousness: awake, patient cooperative and responds to stimulation  Airway & Oxygen Therapy: Patient Spontanous Breathing and Patient connected to nasal cannula oxygen  Post-op Assessment: Report given to RN, Post -op Vital signs reviewed and stable and Patient moving all extremities X 4  Post vital signs: Reviewed and stable  Last Vitals:  Vitals Value Taken Time  BP 149/77 08/10/19 1127  Temp    Pulse 72 08/10/19 1129  Resp 16 08/10/19 1129  SpO2 95 % 08/10/19 1129  Vitals shown include unvalidated device data.  Last Pain:  Vitals:   08/10/19 0818  TempSrc: Oral         Complications: No apparent anesthesia complications

## 2019-08-10 NOTE — Discharge Summary (Signed)
Physician Discharge Summary  Patient ID: Katrina Cunningham MRN: [id number] DOB/AGE: 1956-03-06 63 y.o.  Admit date: 08/10/2019 Discharge date: 08/10/2019  Admission Diagnoses: L1 wedge compression fracture, suspected pathologic fracture; low back pain  Discharge Diagnoses:  L1 wedge compression fracture, suspected pathologic fracture; low back pain Active Problems:   * No active hospital problems. *   Discharged Condition: good  Hospital Course: Patient was admitted and underwent an L1 vertebral body biopsy and kyphoplasty.  Following surgery she is been cared for in the PACU.  She is now up and ambulating.  She is comfortable.  Her incisions are clean and dry.  She is scheduled for follow-up with me in the office in a couple of weeks to review the results of her biopsy and assess her kyphoplasty.  She has been given instruction regarding wound care and activities following discharge.  Discharge Exam: Blood pressure (!) 141/72, pulse 70, temperature (!) 97.2 F (36.2 C), resp. rate 14, height 5\' 6"  (1.676 m), weight 79.8 kg, SpO2 99 %.  Disposition: Home  Discharge Instructions    Discharge wound care:   Complete by: As directed    Leave the wound open to air. Shower daily with the wound uncovered. Water and soapy water should run over the incision area. Do not wash directly on the incision for 2 weeks. Remove the glue after 2 weeks.   Driving Restrictions   Complete by: As directed    No driving for 2 weeks. May ride in the car locally now. May begin to drive locally in 2 weeks.   Other Restrictions   Complete by: As directed    Walk gradually increasing distances out in the fresh air at least twice a day. Walking additional 6 times inside the house, gradually increasing distances, daily. No bending, lifting, or twisting. Perform activities between shoulder and waist height (that is at counter height when standing or table height when sitting).     Allergies as of 08/10/2019    No Known Allergies     Medication List    TAKE these medications   amLODipine 2.5 MG tablet Commonly known as: NORVASC Take 2.5 mg by mouth daily.   atorvastatin 20 MG tablet Commonly known as: LIPITOR Take 20 mg by mouth daily.   diclofenac 75 MG EC tablet Commonly known as: VOLTAREN Take 75 mg by mouth 2 (two) times daily.   levothyroxine 125 MCG tablet Commonly known as: SYNTHROID Take 125 mcg by mouth daily.   metoprolol tartrate 50 MG tablet Commonly known as: LOPRESSOR Take 50 mg by mouth 2 (two) times daily.   mirtazapine 15 MG tablet Commonly known as: REMERON Take 15 mg by mouth at bedtime.   OVER THE COUNTER MEDICATION Take 1 Scoop by mouth daily. Vital Reds Probiotic & Prebiotic   venlafaxine 75 MG tablet Commonly known as: EFFEXOR Take 150 mg by mouth 2 (two) times daily.            Discharge Care Instructions  (From admission, onward)         Start     Ordered   08/10/19 0000  Discharge wound care:    Comments: Leave the wound open to air. Shower daily with the wound uncovered. Water and soapy water should run over the incision area. Do not wash directly on the incision for 2 weeks. Remove the glue after 2 weeks.   08/10/19 1355           Signed: Hosie Spangle, MD 08/10/2019, 1:56  PM

## 2019-08-11 ENCOUNTER — Encounter (HOSPITAL_COMMUNITY): Payer: Self-pay | Admitting: Neurosurgery

## 2019-08-11 LAB — CYTOLOGY - NON PAP

## 2019-08-11 NOTE — Anesthesia Postprocedure Evaluation (Signed)
Anesthesia Post Note  Patient: Katrina Cunningham  Procedure(s) Performed: KYPHOPLASTY LUMBAR ONE WITH BIOPSY (N/A Back)     Patient location during evaluation: PACU Anesthesia Type: General Level of consciousness: awake and alert Pain management: pain level controlled Vital Signs Assessment: post-procedure vital signs reviewed and stable Respiratory status: spontaneous breathing, nonlabored ventilation, respiratory function stable and patient connected to nasal cannula oxygen Cardiovascular status: blood pressure returned to baseline and stable Postop Assessment: no apparent nausea or vomiting Anesthetic complications: no    Last Vitals:  Vitals:   08/10/19 1315 08/10/19 1345  BP: (!) 141/72 118/79  Pulse: 70 64  Resp: 14 16  Temp:    SpO2: 99% 98%    Last Pain:  Vitals:   08/10/19 1200  TempSrc:   PainSc: 0-No pain                 Estil Vallee

## 2019-08-16 LAB — SURGICAL PATHOLOGY

## 2019-08-22 ENCOUNTER — Telehealth: Payer: Self-pay | Admitting: *Deleted

## 2019-08-22 NOTE — Telephone Encounter (Signed)
Notified patient of her new patient appointment at 0800 tomorrow with Dr. Benay Spice. She understands and agrees.

## 2019-08-23 ENCOUNTER — Inpatient Hospital Stay: Payer: BC Managed Care – PPO | Attending: Oncology | Admitting: Oncology

## 2019-08-23 ENCOUNTER — Inpatient Hospital Stay: Payer: BC Managed Care – PPO

## 2019-08-23 ENCOUNTER — Telehealth: Payer: Self-pay | Admitting: Oncology

## 2019-08-23 ENCOUNTER — Encounter: Payer: Self-pay | Admitting: *Deleted

## 2019-08-23 ENCOUNTER — Ambulatory Visit (HOSPITAL_COMMUNITY)
Admission: RE | Admit: 2019-08-23 | Discharge: 2019-08-23 | Disposition: A | Discharge status: Home / Self Care | Payer: BC Managed Care – PPO | Source: Ambulatory Visit | Attending: Oncology | Admitting: Oncology

## 2019-08-23 ENCOUNTER — Other Ambulatory Visit: Payer: Self-pay

## 2019-08-23 VITALS — BP 155/89 | HR 80 | Temp 98.2°F | Resp 18 | Ht 66.0 in | Wt 180.3 lb

## 2019-08-23 DIAGNOSIS — Z7189 Other specified counseling: Secondary | ICD-10-CM | POA: Insufficient documentation

## 2019-08-23 DIAGNOSIS — F17210 Nicotine dependence, cigarettes, uncomplicated: Secondary | ICD-10-CM

## 2019-08-23 DIAGNOSIS — C9000 Multiple myeloma not having achieved remission: Secondary | ICD-10-CM

## 2019-08-23 DIAGNOSIS — E039 Hypothyroidism, unspecified: Secondary | ICD-10-CM | POA: Insufficient documentation

## 2019-08-23 DIAGNOSIS — D630 Anemia in neoplastic disease: Secondary | ICD-10-CM

## 2019-08-23 DIAGNOSIS — I10 Essential (primary) hypertension: Secondary | ICD-10-CM | POA: Insufficient documentation

## 2019-08-23 DIAGNOSIS — Z5112 Encounter for antineoplastic immunotherapy: Secondary | ICD-10-CM | POA: Diagnosis not present

## 2019-08-23 DIAGNOSIS — G893 Neoplasm related pain (acute) (chronic): Secondary | ICD-10-CM

## 2019-08-23 LAB — CMP (CANCER CENTER ONLY)
ALT: 45 U/L — ABNORMAL HIGH (ref 0–44)
AST: 29 U/L (ref 15–41)
Albumin: 4.2 g/dL (ref 3.5–5.0)
Alkaline Phosphatase: 102 U/L (ref 38–126)
Anion gap: 12 (ref 5–15)
BUN: 10 mg/dL (ref 8–23)
CO2: 21 mmol/L — ABNORMAL LOW (ref 22–32)
Calcium: 9.4 mg/dL (ref 8.9–10.3)
Chloride: 105 mmol/L (ref 98–111)
Creatinine: 0.8 mg/dL (ref 0.44–1.00)
GFR, Est AFR Am: >60 mL/min (ref >60)
GFR, Estimated: >60 mL/min (ref >60)
Glucose, Bld: 118 mg/dL — ABNORMAL HIGH (ref 70–99)
Potassium: 4 mmol/L (ref 3.5–5.1)
Sodium: 138 mmol/L (ref 135–145)
Total Bilirubin: 0.4 mg/dL (ref 0.3–1.2)
Total Protein: 8.6 g/dL — ABNORMAL HIGH (ref 6.5–8.1)

## 2019-08-23 LAB — CBC WITH DIFFERENTIAL (CANCER CENTER ONLY)
Abs Immature Granulocytes: 0.03 10*3/uL (ref 0.00–0.07)
Basophils Absolute: 0 10*3/uL (ref 0.0–0.1)
Basophils Relative: 1 %
Eosinophils Absolute: 0.1 10*3/uL (ref 0.0–0.5)
Eosinophils Relative: 1 %
HCT: 35.1 % — ABNORMAL LOW (ref 36.0–46.0)
Hemoglobin: 11.7 g/dL — ABNORMAL LOW (ref 12.0–15.0)
Immature Granulocytes: 1 %
Lymphocytes Relative: 41 %
Lymphs Abs: 2.3 10*3/uL (ref 0.7–4.0)
MCH: 34.3 pg — ABNORMAL HIGH (ref 26.0–34.0)
MCHC: 33.3 g/dL (ref 30.0–36.0)
MCV: 102.9 fL — ABNORMAL HIGH (ref 80.0–100.0)
Monocytes Absolute: 0.4 10*3/uL (ref 0.1–1.0)
Monocytes Relative: 7 %
Neutro Abs: 2.8 10*3/uL (ref 1.7–7.7)
Neutrophils Relative %: 49 %
Platelet Count: 248 10*3/uL (ref 150–400)
RBC: 3.41 MIL/uL — ABNORMAL LOW (ref 3.87–5.11)
RDW: 12.5 % (ref 11.5–15.5)
WBC Count: 5.6 10*3/uL (ref 4.0–10.5)
nRBC: 0 % (ref 0.0–0.2)

## 2019-08-23 LAB — LACTATE DEHYDROGENASE: LDH: 89 U/L — ABNORMAL LOW (ref 98–192)

## 2019-08-23 MED ORDER — HYDROCODONE-ACETAMINOPHEN 5-325 MG PO TABS: 1.0000 | ORAL_TABLET | Freq: Four times a day (QID) | ORAL | 0 refills | Status: DC | PRN

## 2019-08-23 NOTE — Telephone Encounter (Signed)
Scheduled per 11/03 los, called patient regarding upcoming appointments. Left a voicemail.

## 2019-08-23 NOTE — Progress Notes (Signed)
START ON PATHWAY REGIMEN - Multiple Myeloma and Other Plasma Cell Dyscrasias     A cycle is every 21 days:     Bortezomib      Lenalidomide      Dexamethasone   **Always confirm dose/schedule in your pharmacy ordering system**  Patient Characteristics: Newly Diagnosed, Transplant Eligible, Unknown or Awaiting Test Results R-ISS Staging: Unknown Disease Classification: Newly Diagnosed Is Patient Eligible for Transplant<= Transplant Eligible Risk Status: Awaiting Test Results Intent of Therapy: Non-Curative / Palliative Intent, Discussed with Patient 

## 2019-08-23 NOTE — Progress Notes (Signed)
[facility] Patient Consult   Requesting MD: Despina Boan 63 y.o.  7[birth year]    Reason for Consult: Multiple myeloma   HPI: Katrina Cunningham reports the acute onset of low back pain on 07/06/2019.  The pain was "immobilizing ".  She has been followed by Dr. Sherwood Gambler for back pain and has undergone multiple spine surgeries in the past.  Dr. Sherwood Gambler obtain an MRI of the lumbar spine on 07/28/2019.  A compression fracture was noted at L1 with marrow edema and enhancement throughout the vertebral body.  A new round and enhancing lesion was noted in the left side of L3.  A new enhancing lesion was also noted at L5.  Paraspinal and other soft tissues were negative.  She is status post a laminectomy and fusion at L4-5.  The L1 fracture was concerning for a pathologic fracture.  She was taken to an L1 vertebral body biopsy and L1 kyphoplasty by Dr. Sherwood Gambler on 08/10/2019.  The pathology from the L1 biopsy revealed a plasma cell neoplasm.  Monotonous cell population was positive for CD138, CD56, and kappa light chain.  The cytology from an aspiration biopsy was negative for malignancy.  KatrinaGround reports improvement of the back pain following the kyphoplasty, but she still rates the pain at 7/10.  Past Medical History:  Diagnosis Date  . Anxiety   . Depression   . Hyperlipidemia   . Hypertension   . Hypothyroidism   . Substance abuse ([place])    smokes marijuana daily  . Thyroid disease     .  G2 P2   .  Hemorrhoids  Past Surgical History:  Procedure Laterality Date  . BACK SURGERY, L4-5 fusion    . KYPHOPLASTY N/A 08/10/2019   Procedure: KYPHOPLASTY LUMBAR ONE WITH BIOPSY;  Surgeon: Jovita Gamma, MD;  Location: [facility];  Service: Neurosurgery;  Laterality: N/A;    Medications: Reviewed  Allergies: No Known Allergies  Family history: Father died of lung cancer age 28 and was a smoker.  Her mother is alive at age 34 and has a history of breast  cancer.  Social History:   She lives alone in [place].  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  She drinks wine daily.  She smokes 1 cigarette/day, heavier at times.  ROS:   Positives include: Has sweats around the head at night, rectal bleeding when she has a firm stool-relates this to hemorrhoids confirmed by her gynecologist in the past, low back pain radiating into the hips and left leg, decreased visual acuity  A complete ROS was otherwise negative.  Physical Exam:  Blood pressure (!) 155/89, pulse 80, temperature 98.2 F (36.8 C), temperature source Temporal, resp. rate 18, height 5' 6" (1.676 m), weight 180 lb 4.8 oz (81.8 kg), SpO2 100 %.  HEENT: Neck without mass Lungs: Clear bilaterally Cardiac: Regular rate and rhythm Abdomen: No hepatosplenomegaly, no mass, nontender  Vascular: No leg edema Lymph nodes: No cervical, supraclavicular, axillary, or inguinal nodes Neurologic: Motor exam appears intact in the arms, legs, and feet bilaterally Skin: No rash Musculoskeletal: No spine tenderness   LAB:  CBC  Lab Results  Component Value Date   WBC 5.6 08/23/2019   HGB 11.7 (L) 08/23/2019   HCT 35.1 (L) 08/23/2019   MCV 102.9 (H) 08/23/2019   PLT 248 08/23/2019   NEUTROABS 2.8 08/23/2019        CMP  Lab Results  Component Value Date   NA  138 08/23/2019   K 4.0 08/23/2019   CL 105 08/23/2019   CO2 21 (L) 08/23/2019   GLUCOSE 118 (H) 08/23/2019   BUN 10 08/23/2019   CREATININE 0.80 08/23/2019   CALCIUM 9.4 08/23/2019   PROT 8.6 (H) 08/23/2019   ALBUMIN 4.2 08/23/2019   AST 29 08/23/2019   ALT 45 (H) 08/23/2019   ALKPHOS 102 08/23/2019   BILITOT 0.4 08/23/2019   GFRNONAA >60 08/23/2019   GFRAA >60 08/23/2019     No results found for: CEA1  Imaging:  Dg Bone Survey Met  Result Date: 08/23/2019 CLINICAL DATA:  Multiple myeloma. EXAM: METASTATIC BONE SURVEY COMPARISON:  MRI 07/28/2019 head CT 02/21/2012. FINDINGS: Standard imaging of  the axial and appendicular skeleton performed. Prominent lucency noted over the temple region on the lateral skull. To determine if this is a true lucency/lesion an AP view of the skull is suggested for further evaluation. Prior L1 vertebroplasty. Prior L4-L5 fusion. Hardware intact. Anatomic alignment. Thoracolumbar spine scoliosis. Diffuse osteopenia and degenerative change noted throughout the cervical, thoracic, and lumbar spine. No focal appendicular bony lesions identified. Chest x-ray is unremarkable. IMPRESSION: 1. Prominent lucency noted over the temple region on the lateral skull view. To determine if this is a true lucency/lesion an AP view of the skull is suggested for further evaluation. 2.  No other focal abnormality identified. Electronically Signed   By: Marcello Moores  Register   On: 08/23/2019 13:21      Assessment/Plan:   1. Multiple myeloma  L1 core biopsy 08/10/2019-plasma cell neoplasm, kappa light chain restricted  MRI lumbar spine 07/28/2019-acute L1 compression fracture without central canal stenosis, enhancing lesions at L3 and L5 2. Acute L1 pathological pressure fracture, status post a kyphoplasty 08/10/2019  3.   Pain secondary #2  4.  Tobacco and alcohol use  5.  History of multiple back surgeries  6.  Anemia secondary to #1  7.  Anxiety/depression  Disposition:   Katrina Cunningham presented with an acute L1 compression fracture.  A biopsy of L1 confirmed a plasma cell neoplasm.  Additional enhancing lesions were noted at L3 and L5.  The clinical presentation is consistent with a diagnosis of multiple myeloma.  I discussed the diagnosis, prognosis, and treatment options with Ms. Bolck and her daughter.  She understands there is a high chance of obtaining a clinical remission with a multiagent systemic therapy regimen.  We will request a myeloma FISH panel to establish her disease risk category.  I recommend induction therapy with RVD.  We began the process for enrollment  in the lenalidomide program today.  She has a pathologic fracture and multiple bone lesions.  I recommend biphosphonate therapy.  We reviewed the potential toxicities associated with Zometa including the chance of osteonecrosis.  She will be scheduled for Zometa on 08/26/2019.  I recommended she schedule a dental evaluation for as soon as possible.  We obtained a myeloma panel today.  She will be referred for a metastatic bone survey.  Ms. Borello will use hydrocodone as needed for pain.  She will return for an office visit and further discussion on 08/26/2019. The plan is to initiate systemic therapy for multiple myeloma within the next 1-2 weeks.  I will refer her for a stem cell evaluation.  Betsy Coder, MD  08/23/2019, 4:21 PM

## 2019-08-23 NOTE — Progress Notes (Signed)
Registered patient with [employer] program for Revlimid and provided verbal and printed information on Revlimid and Zometa as well as information on multiple myeloma. Bone survey scheduled for today at 11:00. Script for Revlimid forwarded to oral chemotherapy specialist pharmacist folder to process. 

## 2019-08-24 ENCOUNTER — Telehealth: Payer: Self-pay | Admitting: Pharmacist

## 2019-08-24 ENCOUNTER — Telehealth: Payer: Self-pay

## 2019-08-24 ENCOUNTER — Encounter: Payer: Self-pay | Admitting: *Deleted

## 2019-08-24 DIAGNOSIS — C9000 Multiple myeloma not having achieved remission: Secondary | ICD-10-CM

## 2019-08-24 LAB — MULTIPLE MYELOMA PANEL, SERUM
Albumin SerPl Elph-Mcnc: 4.1 g/dL (ref 2.9–4.4)
Albumin/Glob SerPl: 1.1 (ref 0.7–1.7)
Alpha 1: 0.2 g/dL (ref 0.0–0.4)
Alpha2 Glob SerPl Elph-Mcnc: 0.9 g/dL (ref 0.4–1.0)
B-Globulin SerPl Elph-Mcnc: 1 g/dL (ref 0.7–1.3)
Gamma Glob SerPl Elph-Mcnc: 1.9 g/dL — ABNORMAL HIGH (ref 0.4–1.8)
Globulin, Total: 4 g/dL — ABNORMAL HIGH (ref 2.2–3.9)
IgA: 62 mg/dL — ABNORMAL LOW (ref 87–352)
IgG (Immunoglobin G), Serum: 2154 mg/dL — ABNORMAL HIGH (ref 586–1602)
IgM (Immunoglobulin M), Srm: 20 mg/dL — ABNORMAL LOW (ref 26–217)
M Protein SerPl Elph-Mcnc: 1.6 g/dL — ABNORMAL HIGH
Total Protein ELP: 8.1 g/dL (ref 6.0–8.5)

## 2019-08-24 LAB — KAPPA/LAMBDA LIGHT CHAINS
Kappa free light chain: 45.4 mg/L — ABNORMAL HIGH (ref 3.3–19.4)
Kappa, lambda light chain ratio: 5.4 — ABNORMAL HIGH (ref 0.26–1.65)
Lambda free light chains: 8.4 mg/L (ref 5.7–26.3)

## 2019-08-24 NOTE — Telephone Encounter (Signed)
Oral Oncology Patient Advocate Encounter  Received notification from [facility] that prior authorization for Revlimid is required.  PA submitted on CoverMyMeds Key [id number] Status is pending  Oral Oncology Clinic will continue to follow.  [place] Patient [place] Phone [phone] Fax [phone] 08/24/2019    1:52 PM

## 2019-08-24 NOTE — Telephone Encounter (Signed)
Oral Oncology Patient Advocate Encounter  Prior Authorization for Revlimid has been approved.    PA# [id number] Effective dates: 08/24/19 through 08/22/20  Oral Oncology Clinic will continue to follow.   [place] Patient [place] Phone [phone] Fax [phone] 08/24/2019    1:53 PM

## 2019-08-24 NOTE — Progress Notes (Signed)
Per Dr. Benay Spice request, email to [facility] requesting myeloma FISH panel on case #[id number]

## 2019-08-24 NOTE — Telephone Encounter (Signed)
Oral Oncology Pharmacist Encounter  Received new prescription for Revlimid (lenalidomide) for the treatment of newly diagnosed multiple myeloma not having achieved remission in conjunction with bortezomib and dexamethasone, planned duration 4-6 cycles then reassessment. Plan is to start combination therapy in the next 1-2 weeks  Revlimid is planned to be initiated at 25 mg by mouth once daily for 14 days on, 7 days off, repeat every 21 days Bortezomib is planned to be initiated at 1.3 mg/m2 SQ given once weekly Per treatment plan, dexamethasone is planned to be administered at 40 mg by mouth once weekly, likely given in clinic on the days of bortezomib adminsitration  Labs from 08/23/19 assessed, OK for treatment initiation. SCr=0.8, est CrCl ~ 90 mL/min  Current medication list in Epic reviewed, no DDIs with lenalidomide identified.   Patient will need aspirin 81 mg by mouth once daily or similar agent for thromboprophylaxis  Patient will need acyclovir 400 mg by mouth twice daily for VZV prophylaxis  Prescription for Revlimid will be e-scribed to appropriate specialty pharmacy for dispensing once insurance authorization is approved. Revlimid is not available for dispensing at the [facility] as it is a limited distribution medication.  [employer] authorization # [id number]  Oral Oncology Clinic will continue to follow for insurance authorization, copayment issues, initial counseling and start date.  Johny Drilling, PharmD, BCPS, BCOP  08/24/2019 12:56 PM Oral Oncology Clinic [phone]

## 2019-08-26 ENCOUNTER — Inpatient Hospital Stay: Payer: BC Managed Care – PPO

## 2019-08-26 ENCOUNTER — Other Ambulatory Visit: Payer: Self-pay | Admitting: *Deleted

## 2019-08-26 ENCOUNTER — Inpatient Hospital Stay (HOSPITAL_BASED_OUTPATIENT_CLINIC_OR_DEPARTMENT_OTHER): Payer: BC Managed Care – PPO | Admitting: Nurse Practitioner

## 2019-08-26 ENCOUNTER — Other Ambulatory Visit: Payer: Self-pay

## 2019-08-26 ENCOUNTER — Other Ambulatory Visit: Payer: BC Managed Care – PPO

## 2019-08-26 ENCOUNTER — Telehealth: Payer: Self-pay | Admitting: *Deleted

## 2019-08-26 VITALS — BP 149/77 | HR 85 | Temp 98.5°F | Resp 18 | Ht 66.0 in | Wt 178.5 lb

## 2019-08-26 DIAGNOSIS — C9000 Multiple myeloma not having achieved remission: Secondary | ICD-10-CM | POA: Diagnosis not present

## 2019-08-26 DIAGNOSIS — Z5112 Encounter for antineoplastic immunotherapy: Secondary | ICD-10-CM | POA: Diagnosis not present

## 2019-08-26 MED ORDER — ZOLEDRONIC ACID 4 MG/100ML IV SOLN
4.0000 mg | Freq: Once | INTRAVENOUS | Status: AC
  Administered 2019-08-26: 4 mg via INTRAVENOUS
  Filled 2019-08-26: qty 100

## 2019-08-26 MED ORDER — SODIUM CHLORIDE 0.9 % IV SOLN
Freq: Once | INTRAVENOUS | Status: AC
  Administered 2019-08-26: 12:00:00 via INTRAVENOUS
  Filled 2019-08-26: qty 250

## 2019-08-26 MED ORDER — LENALIDOMIDE 25 MG PO CAPS: 25.0000 mg | ORAL_CAPSULE | Freq: Every day | ORAL | 0 refills | Status: DC

## 2019-08-26 MED ORDER — ACYCLOVIR 400 MG PO TABS: 400.0000 mg | ORAL_TABLET | Freq: Two times a day (BID) | ORAL | 5 refills | Status: AC

## 2019-08-26 MED ORDER — PROCHLORPERAZINE MALEATE 10 MG PO TABS: 10.0000 mg | ORAL_TABLET | Freq: Four times a day (QID) | ORAL | 1 refills | Status: DC | PRN

## 2019-08-26 NOTE — Telephone Encounter (Signed)
Oral Oncology Pharmacist Encounter  Insurance authorization for Revlimid has been obtained. Prescription has been e-scribed to [facility] specialty pharmacy. I will follow-up with dispensing pharmacy on Monday 11/9 to ensure timely processing of patient's prescription.  Johny Drilling, PharmD, BCPS, BCOP  08/26/2019 10:43 AM Oral Oncology Clinic [phone]

## 2019-08-26 NOTE — Progress Notes (Signed)
Pt tolerated first zometa infusion well, no complications at time of d/c.

## 2019-08-26 NOTE — Telephone Encounter (Signed)
Oral Chemotherapy Pharmacist Encounter   I spoke with patient and daughter, Katrina Cunningham, in exam room for overview of: Revlimid (lenalidomide) for the treatment of newly diagnosed multiple myeloma not having achieved remission in conjunction with bortezomib and dexamethasone, planned duration 4-6 cycles then reassessment.   Counseled patient on administration, dosing, side effects, monitoring, drug-food interactions, safe handling, storage, and disposal.  Patient will take Revlimid 66m capsules, 1 capsule by mouth once daily, without regard to food, with a full glass of water.  Revlimid will be given 14 days on, 7 days off, repeat every 21 days.  Revlimid start date: 09/01/19  Adverse effects of Revlimid include but are not limited to: nausea, constipation, diarrhea, abdominal pain, rash, fatigue, drug fever, and decreased blood counts.    Reviewed with patient importance of keeping a medication schedule and plan for any missed doses.  Medication reconciliation performed and medication/allergy list updated.  Patient counseled on importance of daily aspirin 87mfor VTE prophylaxis and acyclovir for VZV prophylaxis. Patient will receive dexamethasone in clinic on bortezomib days.  Insurance authorization for Revlimid has been obtained.  Revlimid prescription is being dispensed from [facility] it is a limited distribution medication. We will continue to follow-up status at [facility] to ensure timely dispensing of medication.  All questions answered.  Katrina Cunningham voiced understanding and appreciation.   Patient knows to call the office with questions or concerns.  JeJohny DrillingPharmD, BCPS, BCOP  08/26/2019    11:55 AM Oral Oncology Clinic [phone]

## 2019-08-26 NOTE — Telephone Encounter (Signed)
Referral faxed to [facility] - Dr. Blinda Leatherwood - release AM:[phone]

## 2019-08-26 NOTE — Progress Notes (Addendum)
[facility] OFFICE PROGRESS NOTE   Diagnosis: Multiple myeloma  INTERVAL HISTORY:   Ms. Rendall returns as scheduled.  She continues to have left-sided back pain.  She takes Tylenol or hydrocodone.  Objective:  Vital signs in last 24 hours:  Blood pressure (!) 149/77, pulse 85, temperature 98.5 F (36.9 C), temperature source Temporal, resp. rate 18, height _0  (1.676 m), weight 178 lb 8 oz (81 kg), SpO2 100 %.    GI: Abdomen soft and nontender.  No hepatomegaly. Vascular: No leg edema. Neuro: Alert and oriented.    Lab Results:  Lab Results  Component Value Date   WBC 5.6 08/23/2019   HGB 11.7 (L) 08/23/2019   HCT 35.1 (L) 08/23/2019   MCV 102.9 (H) 08/23/2019   PLT 248 08/23/2019   NEUTROABS 2.8 08/23/2019    Imaging:  No results found.  Medications: I have reviewed the patient's current medications.  Assessment/Plan: 1. Multiple myeloma-IgG kappa ? 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 MRI lumbar spine 07/28/2019-acute L1 compression fracture without central canal stenosis, enhancing lesions at L3 and L5 ? Bone survey 08/23/2019-prominent lucency over the temporal region on the lateral skull view.  No other focal abnormality identified. ? 08/23/2019 serum kappa light chains 45, serum M spike 1.6, IgG 2154, serum immunofixation-IgG kappa 2. Acute L1 pathological pressure fracture, status post a kyphoplasty 08/10/2019  3.   Pain secondary #2  4.  Tobacco and alcohol use  5.  History of multiple back surgeries  6.  Anemia secondary to #1  7.  Anxiety/depression  Disposition: Ms. Tonkinson appears stable.  We again reviewed the diagnosis of multiple myeloma and Dr. Gearldine Shown recommendation for RVD.  We discussed potential toxicities associated with this regimen.  She attended a chemotherapy education class today.  In addition the oral chemotherapy pharmacist met with her at today's visit.  She agrees  to proceed.  She understands the rationale for aspirin and acyclovir prophylaxis.    We are referring her for a bone marrow biopsy.    We discussed the bone survey results which question a possible lesion over the left temple region.  We are referring her for additional skull x-rays.  We made a referral to Dr. Norma Fredrickson at [facility] for stem cell evaluation.  She will receive Zometa today.  She will schedule dental evaluation as soon as possible.  She will return for lab, follow-up, Velcade and dexamethasone in 1 week.  She will contact the office in the interim with any problems.  Patient seen with Dr. Benay Spice.  40 minutes were spent face-to-face at today's visit with the majority of that time involved in counseling/coordination of care.    Ned Card ANP/GNP-BC   08/26/2019  10:04 AM  This was a shared visit with Ned Card.  Ms. Jean has been diagnosed with multiple myeloma.  She will be referred for a bone marrow biopsy to establish a baseline prior to treatment and obtain tissue for molecular testing.  Molecular testing could not be performed on the core biopsy.  The plan is to begin treatment with RVD.  We reviewed potential toxicities associated with this regimen including the chance for nausea, hematologic toxicity, diarrhea, infection, neuropathy, and thromboembolic disease.  She attended a chemotherapy teaching class today.  She will receive a first dose of Zometa today.  The plan is to initiate RVD therapy after the bone marrow biopsy.   We made a referral for a transplant evaluation.  Leroy Sea  Benay Spice, MD

## 2019-08-26 NOTE — Patient Instructions (Signed)
Zoledronic Acid injection (Hypercalcemia, Oncology) What is this medicine? ZOLEDRONIC ACID (ZOE le dron ik AS id) lowers the amount of calcium loss from bone. It is used to treat too much calcium in your blood from cancer. It is also used to prevent complications of cancer that has spread to the bone. This medicine may be used for other purposes; ask your health care provider or pharmacist if you have questions. COMMON BRAND NAME(S): Zometa What should I tell my health care provider before I take this medicine? They need to know if you have any of these conditions:  aspirin-sensitive asthma  cancer, especially if you are receiving medicines used to treat cancer  dental disease or wear dentures  infection  kidney disease  receiving corticosteroids like dexamethasone or prednisone  an unusual or allergic reaction to zoledronic acid, other medicines, foods, dyes, or preservatives  pregnant or trying to get pregnant  breast-feeding How should I use this medicine? This medicine is for infusion into a vein. It is given by a health care professional in a hospital or clinic setting. Talk to your pediatrician regarding the use of this medicine in children. Special care may be needed. Overdosage: If you think you have taken too much of this medicine contact a poison control center or emergency room at once. NOTE: This medicine is only for you. Do not share this medicine with others. What if I miss a dose? It is important not to miss your dose. Call your doctor or health care professional if you are unable to keep an appointment. What may interact with this medicine?  certain antibiotics given by injection  NSAIDs, medicines for pain and inflammation, like ibuprofen or naproxen  some diuretics like bumetanide, furosemide  teriparatide  thalidomide This list may not describe all possible interactions. Give your health care provider a list of all the medicines, herbs, non-prescription  drugs, or dietary supplements you use. Also tell them if you smoke, drink alcohol, or use illegal drugs. Some items may interact with your medicine. What should I watch for while using this medicine? Visit your doctor or health care professional for regular checkups. It may be some time before you see the benefit from this medicine. Do not stop taking your medicine unless your doctor tells you to. Your doctor may order blood tests or other tests to see how you are doing. Women should inform their doctor if they wish to become pregnant or think they might be pregnant. There is a potential for serious side effects to an unborn child. Talk to your health care professional or pharmacist for more information. You should make sure that you get enough calcium and vitamin D while you are taking this medicine. Discuss the foods you eat and the vitamins you take with your health care professional. Some people who take this medicine have severe bone, joint, and/or muscle pain. This medicine may also increase your risk for jaw problems or a broken thigh bone. Tell your doctor right away if you have severe pain in your jaw, bones, joints, or muscles. Tell your doctor if you have any pain that does not go away or that gets worse. Tell your dentist and dental surgeon that you are taking this medicine. You should not have major dental surgery while on this medicine. See your dentist to have a dental exam and fix any dental problems before starting this medicine. Take good care of your teeth while on this medicine. Make sure you see your dentist for regular follow-up   appointments. What side effects may I notice from receiving this medicine? Side effects that you should report to your doctor or health care professional as soon as possible:  allergic reactions like skin rash, itching or hives, swelling of the face, lips, or tongue  anxiety, confusion, or depression  breathing problems  changes in vision  eye  pain  feeling faint or lightheaded, falls  jaw pain, especially after dental work  mouth sores  muscle cramps, stiffness, or weakness  redness, blistering, peeling or loosening of the skin, including inside the mouth  trouble passing urine or change in the amount of urine Side effects that usually do not require medical attention (report to your doctor or health care professional if they continue or are bothersome):  bone, joint, or muscle pain  constipation  diarrhea  fever  hair loss  irritation at site where injected  loss of appetite  nausea, vomiting  stomach upset  trouble sleeping  trouble swallowing  weak or tired This list may not describe all possible side effects. Call your doctor for medical advice about side effects. You may report side effects to FDA at 1-800-FDA-1088. Where should I keep my medicine? This drug is given in a hospital or clinic and will not be stored at home. NOTE: This sheet is a summary. It may not cover all possible information. If you have questions about this medicine, talk to your doctor, pharmacist, or health care provider.  2020 Elsevier/Gold Standard (2014-03-04 14:19:39)  

## 2019-08-29 ENCOUNTER — Telehealth: Payer: Self-pay | Admitting: Oncology

## 2019-08-29 ENCOUNTER — Encounter: Payer: Self-pay | Admitting: Nurse Practitioner

## 2019-08-29 ENCOUNTER — Encounter: Payer: Self-pay | Admitting: *Deleted

## 2019-08-29 ENCOUNTER — Telehealth: Payer: Self-pay | Admitting: *Deleted

## 2019-08-29 ENCOUNTER — Other Ambulatory Visit: Payer: Self-pay

## 2019-08-29 ENCOUNTER — Ambulatory Visit (HOSPITAL_COMMUNITY)
Admission: RE | Admit: 2019-08-29 | Discharge: 2019-08-29 | Disposition: A | Discharge status: Home / Self Care | Payer: BC Managed Care – PPO | Source: Ambulatory Visit | Attending: Nurse Practitioner | Admitting: Nurse Practitioner

## 2019-08-29 DIAGNOSIS — C9000 Multiple myeloma not having achieved remission: Secondary | ICD-10-CM

## 2019-08-29 NOTE — Telephone Encounter (Signed)
Called to inquire if OK to continue to have her hair colored at the hairdresser? Informed her that this is not recommended due to increased potential to damage her hair/scalp and possible cause more hair loss than expected. She expressed understanding and agrees. Wanted MD aware that she had her skull xray today.

## 2019-08-29 NOTE — Progress Notes (Signed)
[place] Work  Clinical Social Work received referral from medical oncology for emotional support.  CSW contacted patient at home to offer support and assess for needs.  Patient stated she was "doing ok, but felt sad".  "Today was a hard day".  CSW and patient discussed common feelings and emotions when being diagnosed with cancer, and the importance of support.  Patient identified her family and friends as positive support.  CSW provided education on the support programs and support team at [facility].  Patient was agreeable to being added to the support program calendar list.  CSW also sent patient the invitation link to the Blood Cancer support group.  CSW offered additional support and encouraged patient to call with needs or concerns.  Patient was appreciative of CSW contact.    Johnnye Lana, MSW, LCSW, OSW-C Clinical Social Worker [facility] [phone]

## 2019-08-29 NOTE — Telephone Encounter (Signed)
Scheduled per los. Called and spoke with patient. Confirmed appt 

## 2019-08-30 ENCOUNTER — Telehealth: Payer: Self-pay | Admitting: *Deleted

## 2019-08-30 ENCOUNTER — Telehealth: Payer: Self-pay | Admitting: Nurse Practitioner

## 2019-08-30 NOTE — Telephone Encounter (Signed)
I called Katrina Cunningham to review the skull x-ray results.  The lucency at the temporal region is likely a normal variant and not related to myeloma.  She expressed understanding and will follow up as scheduled.

## 2019-08-30 NOTE — Telephone Encounter (Signed)
Called to request MD explain her skull xray results to her. Sees report in Mychart, but does not understand. MD notified

## 2019-08-31 ENCOUNTER — Other Ambulatory Visit: Payer: Self-pay | Admitting: Physician Assistant

## 2019-09-01 ENCOUNTER — Encounter (HOSPITAL_COMMUNITY): Payer: Self-pay

## 2019-09-01 ENCOUNTER — Ambulatory Visit (HOSPITAL_COMMUNITY)
Admission: RE | Admit: 2019-09-01 | Discharge: 2019-09-01 | Disposition: A | Discharge status: Home / Self Care | Payer: BC Managed Care – PPO | Source: Ambulatory Visit | Attending: Oncology | Admitting: Oncology

## 2019-09-01 ENCOUNTER — Ambulatory Visit (HOSPITAL_COMMUNITY)
Admission: RE | Admit: 2019-09-01 | Discharge: 2019-09-01 | Disposition: A | Discharge status: Home / Self Care | Payer: BC Managed Care – PPO | Source: Ambulatory Visit | Attending: Nurse Practitioner | Admitting: Nurse Practitioner

## 2019-09-01 ENCOUNTER — Other Ambulatory Visit: Payer: Self-pay

## 2019-09-01 DIAGNOSIS — E039 Hypothyroidism, unspecified: Secondary | ICD-10-CM | POA: Insufficient documentation

## 2019-09-01 DIAGNOSIS — Z79899 Other long term (current) drug therapy: Secondary | ICD-10-CM | POA: Diagnosis not present

## 2019-09-01 DIAGNOSIS — Z803 Family history of malignant neoplasm of breast: Secondary | ICD-10-CM | POA: Diagnosis not present

## 2019-09-01 DIAGNOSIS — C9000 Multiple myeloma not having achieved remission: Secondary | ICD-10-CM | POA: Diagnosis not present

## 2019-09-01 DIAGNOSIS — I10 Essential (primary) hypertension: Secondary | ICD-10-CM | POA: Insufficient documentation

## 2019-09-01 DIAGNOSIS — F17210 Nicotine dependence, cigarettes, uncomplicated: Secondary | ICD-10-CM | POA: Insufficient documentation

## 2019-09-01 DIAGNOSIS — Z791 Long term (current) use of non-steroidal anti-inflammatories (NSAID): Secondary | ICD-10-CM | POA: Insufficient documentation

## 2019-09-01 DIAGNOSIS — F329 Major depressive disorder, single episode, unspecified: Secondary | ICD-10-CM | POA: Insufficient documentation

## 2019-09-01 DIAGNOSIS — F1211 Cannabis abuse, in remission: Secondary | ICD-10-CM | POA: Diagnosis not present

## 2019-09-01 DIAGNOSIS — Z8349 Family history of other endocrine, nutritional and metabolic diseases: Secondary | ICD-10-CM | POA: Diagnosis not present

## 2019-09-01 DIAGNOSIS — E785 Hyperlipidemia, unspecified: Secondary | ICD-10-CM | POA: Diagnosis not present

## 2019-09-01 DIAGNOSIS — F419 Anxiety disorder, unspecified: Secondary | ICD-10-CM | POA: Insufficient documentation

## 2019-09-01 DIAGNOSIS — Z79890 Hormone replacement therapy (postmenopausal): Secondary | ICD-10-CM | POA: Insufficient documentation

## 2019-09-01 LAB — CBC WITH DIFFERENTIAL/PLATELET
Abs Immature Granulocytes: 0.03 10*3/uL (ref 0.00–0.07)
Basophils Absolute: 0.1 10*3/uL (ref 0.0–0.1)
Basophils Relative: 1 %
Eosinophils Absolute: 0.1 10*3/uL (ref 0.0–0.5)
Eosinophils Relative: 1 %
HCT: 37.1 % (ref 36.0–46.0)
Hemoglobin: 12.9 g/dL (ref 12.0–15.0)
Immature Granulocytes: 0 %
Lymphocytes Relative: 42 %
Lymphs Abs: 3 10*3/uL (ref 0.7–4.0)
MCH: 36.2 pg — ABNORMAL HIGH (ref 26.0–34.0)
MCHC: 34.8 g/dL (ref 30.0–36.0)
MCV: 104.2 fL — ABNORMAL HIGH (ref 80.0–100.0)
Monocytes Absolute: 0.6 10*3/uL (ref 0.1–1.0)
Monocytes Relative: 8 %
Neutro Abs: 3.4 10*3/uL (ref 1.7–7.7)
Neutrophils Relative %: 48 %
Platelets: 277 10*3/uL (ref 150–400)
RBC: 3.56 MIL/uL — ABNORMAL LOW (ref 3.87–5.11)
RDW: 12.5 % (ref 11.5–15.5)
WBC: 7.2 10*3/uL (ref 4.0–10.5)
nRBC: 0 % (ref 0.0–0.2)

## 2019-09-01 LAB — PROTIME-INR
INR: 1 (ref 0.8–1.2)
Prothrombin Time: 13.3 seconds (ref 11.4–15.2)

## 2019-09-01 MED ORDER — FENTANYL CITRATE (PF) 100 MCG/2ML IJ SOLN
INTRAMUSCULAR | Status: AC | PRN
  Administered 2019-09-01 (×2): 50 ug via INTRAVENOUS

## 2019-09-01 MED ORDER — SODIUM CHLORIDE 0.9 % IV SOLN
INTRAVENOUS | Status: DC
  Administered 2019-09-01: 08:00:00 via INTRAVENOUS

## 2019-09-01 MED ORDER — MIDAZOLAM HCL 2 MG/2ML IJ SOLN
INTRAMUSCULAR | Status: AC
  Filled 2019-09-01: qty 4

## 2019-09-01 MED ORDER — FENTANYL CITRATE (PF) 100 MCG/2ML IJ SOLN
INTRAMUSCULAR | Status: AC
  Filled 2019-09-01: qty 2

## 2019-09-01 MED ORDER — MIDAZOLAM HCL 2 MG/2ML IJ SOLN
INTRAMUSCULAR | Status: AC | PRN
  Administered 2019-09-01 (×3): 1 mg via INTRAVENOUS

## 2019-09-01 NOTE — Discharge Instructions (Signed)
For any question or concerns call [phone]; For after hours call [phone] and ask for on call MD   Moderate Conscious Sedation, Adult, Care After These instructions provide you with information about caring for yourself after your procedure. Your health care provider may also give you more specific instructions. Your treatment has been planned according to current medical practices, but problems sometimes occur. Call your health care provider if you have any problems or questions after your procedure. What can I expect after the procedure? After your procedure, it is common:  To feel sleepy for several hours.  To feel clumsy and have poor balance for several hours.  To have poor judgment for several hours.  To vomit if you eat too soon. Follow these instructions at home: For at least 24 hours after the procedure:   Do not: ? Participate in activities where you could fall or become injured. ? Drive. ? Use heavy machinery. ? Drink alcohol. ? Take sleeping pills or medicines that cause drowsiness. ? Make important decisions or sign legal documents. ? Take care of children on your own.  Rest. Eating and drinking  Follow the diet recommended by your health care provider.  If you vomit: ? Drink water, juice, or soup when you can drink without vomiting. ? Make sure you have little or no nausea before eating solid foods. General instructions  Have a responsible adult stay with you until you are awake and alert.  Take over-the-counter and prescription medicines only as told by your health care provider.  If you smoke, do not smoke without supervision.  Keep all follow-up visits as told by your health care provider. This is important. Contact a health care provider if:  You keep feeling nauseous or you keep vomiting.  You feel light-headed.  You develop a rash.  You have a fever. Get help right away if:  You have trouble breathing. This information is not  intended to replace advice given to you by your health care provider. Make sure you discuss any questions you have with your health care provider. Document Released: 07/27/2013 Document Revised: 09/18/2017 Document Reviewed: 01/26/2016 Elsevier Patient Education  2020 [place].   Bone Marrow Aspiration and Bone Marrow Biopsy, Adult, Care After This sheet gives you information about how to care for yourself after your procedure. Your health care provider may also give you more specific instructions. If you have problems or questions, contact your health care provider. What can I expect after the procedure? After the procedure, it is common to have:  Mild pain and tenderness.  Swelling.  Bruising. Follow these instructions at home: Puncture site care      Follow instructions from your health care provider about how to take care of the puncture site. Make sure you: ? Wash your hands with soap and water before you change your bandage (dressing). If soap and water are not available, use hand sanitizer. ? Change your dressing as told by your health care provider.  Check your puncture siteevery day for signs of infection. Check for: ? More redness, swelling, or pain. ? More fluid or blood. ? Warmth. ? Pus or a bad smell. General instructions  Take over-the-counter and prescription medicines only as told by your health care provider.  Do not take baths, swim, or use a hot tub until your health care provider approves. Ask if you can take a shower or have a sponge bath.  Return to your normal activities as told by your health care provider. Ask  your health care provider what activities are safe for you.  Do not drive for 24 hours if you were given a medicine to help you relax (sedative) during your procedure.  Keep all follow-up visits as told by your health care provider. This is important. Contact a health care provider if:  Your pain is not controlled with medicine. Get  help right away if:  You have a fever.  You have more redness, swelling, or pain around the puncture site.  You have more fluid or blood coming from the puncture site.  Your puncture site feels warm to the touch.  You have pus or a bad smell coming from the puncture site. These symptoms may represent a serious problem that is an emergency. Do not wait to see if the symptoms will go away. Get medical help right away. Call your local emergency services (911 in the [place] Do not drive yourself to the hospital. Summary  After the procedure, it is common to have mild pain, tenderness, swelling, and bruising.  Follow instructions from your health care provider about how to take care of the puncture site.  Get help right away if you have any symptoms of infection or if you have more blood or fluid coming from the puncture site. This information is not intended to replace advice given to you by your health care provider. Make sure you discuss any questions you have with your health care provider. Document Released: 04/25/2005 Document Revised: 01/19/2018 Document Reviewed: 03/19/2016 Elsevier Patient Education  2020 [employer].

## 2019-09-01 NOTE — H&P (Signed)
Chief Complaint: Patient was seen in consultation today for multiple myeloma/bone marrow biopsy and aspiration.  Referring Physician(s): Owens Shark  Supervising Physician: Sandi Mariscal  Patient Status: [facility] - Out-pt  History of Present Illness: Katrina Cunningham is a 63 y.o. female with a past medical history of hypertension, hyperlipidemia, hypothyroidism, multiple myeloma, depression, anxiety, and substance abuse (marijuana). She was unfortunately diagnosed with multiple myeloma in 07/2019. She has undergone kyphoplasty and biopsy of acute L1 pathological compression fracture 08/10/2019. Her cancer is managed by Dr. Benay Spice and Ned Card, NP. She has tentative plans for oral chemotherapy for management.  IR requested by Ned Card, NP for possible image-guided bone marrow biopsy/aspiration for staging prior to chemotherapy initiation. Patient awake and alert laying in bed. Complains of headache, rated 2/10 at this time. States it is a "stress headache". Complains of nausea. States that she is hungry. Denies vomiting or abdominal pain. Denies fever, chills, chest pain, dyspnea, or abdominal pain.   Past Medical History:  Diagnosis Date  . Anxiety   . Depression   . Hyperlipidemia   . Hypertension   . Hypothyroidism   . Substance abuse ([place])    smokes marijuana daily  . Thyroid disease     Past Surgical History:  Procedure Laterality Date  . BACK SURGERY    . KYPHOPLASTY N/A 08/10/2019   Procedure: KYPHOPLASTY LUMBAR ONE WITH BIOPSY;  Surgeon: Jovita Gamma, MD;  Location: [facility];  Service: Neurosurgery;  Laterality: N/A;    Allergies: Patient has no known allergies.  Medications: Prior to Admission medications   Medication Sig Start Date End Date Taking? Authorizing Provider  acyclovir (ZOVIRAX) 400 MG tablet Take 1 tablet (400 mg total) by mouth 2 (two) times daily. 08/26/19  Yes Owens Shark, NP  amLODipine (NORVASC) 2.5 MG tablet Take 2.5 mg by mouth  daily.   Yes [provider]  atorvastatin (LIPITOR) 20 MG tablet Take 20 mg by mouth daily.   Yes [provider]  diclofenac (VOLTAREN) 75 MG EC tablet Take 75 mg by mouth 2 (two) times daily. 07/26/19  Yes [provider]  HYDROcodone-acetaminophen (NORCO/VICODIN) 5-325 MG tablet Take 1 tablet by mouth every 6 (six) hours as needed for moderate pain or severe pain. 08/23/19  Yes Ladell Pier, MD  levothyroxine (SYNTHROID, LEVOTHROID) 125 MCG tablet Take 125 mcg by mouth daily.   Yes [provider]  metoprolol (LOPRESSOR) 50 MG tablet Take 50 mg by mouth 2 (two) times daily.   Yes [provider]  mirtazapine (REMERON) 15 MG tablet Take 15 mg by mouth at bedtime.   Yes [provider]  OVER THE COUNTER MEDICATION Take 1 Scoop by mouth daily. Vital Reds Probiotic & Prebiotic   Yes [provider]  venlafaxine (EFFEXOR) 75 MG tablet Take 150 mg by mouth 2 (two) times daily.   Yes [provider]  lenalidomide (REVLIMID) 25 MG capsule Take 1 capsule (25 mg total) by mouth daily. Take for 14 days on, 7 days off, repeat every 21 days. [employer] auth# [id number] 08/26/19   Ladell Pier, MD  prochlorperazine (COMPAZINE) 10 MG tablet Take 1 tablet (10 mg total) by mouth every 6 (six) hours as needed. 08/26/19   Ladell Pier, MD     Family History  Problem Relation Age of Onset  . Breast cancer Mother   . Thyroid disease Mother     Social History   Socioeconomic History  . Marital status: Divorced  Spouse name: Not on file  . Number of children: Not on file  . Years of education: Not on file  . Highest education level: Not on file  Occupational History  . Not on file  Social Needs  . Financial resource strain: Not on file  . Food insecurity    Worry: Not on file    Inability: Not on file  . Transportation needs    Medical: Not on file    Non-medical: Not on file  Tobacco Use  . Smoking status: Current Every  Day Smoker    Packs/day: 0.15    Years: 20.00    Pack years: 3.00    Types: Cigarettes  . Smokeless tobacco: Never Used  Substance and Sexual Activity  . Alcohol use: Yes    Comment: 30 glasses of wine per week  . Drug use: Yes    Types: Marijuana    Comment: uses marijuana daily - last use 08/08/19  . Sexual activity: Not Currently    Birth control/protection: Post-menopausal  Lifestyle  . Physical activity    Days per week: Not on file    Minutes per session: Not on file  . Stress: Not on file  Relationships  . Social Herbalist on phone: Not on file    Gets together: Not on file    Attends religious service: Not on file    Active member of club or organization: Not on file    Attends meetings of clubs or organizations: Not on file    Relationship status: Not on file  Other Topics Concern  . Not on file  Social History Narrative  . Not on file     Review of Systems: A 12 point ROS discussed and pertinent positives are indicated in the HPI above.  All other systems are negative.  Review of Systems  Constitutional: Negative for chills and fever.  Respiratory: Negative for shortness of breath and wheezing.   Cardiovascular: Negative for chest pain and palpitations.  Gastrointestinal: Positive for nausea. Negative for abdominal pain and vomiting.  Neurological: Negative for headaches.  Psychiatric/Behavioral: Negative for behavioral problems and confusion.    Vital Signs: BP (!) 128/91 (BP Location: Right Arm)   Pulse 87   Temp 97.7 F (36.5 C) (Oral)   Resp 18   LMP  (LMP Unknown)   SpO2 97%   Physical Exam Vitals signs and nursing note reviewed.  Constitutional:      General: She is not in acute distress.    Appearance: Normal appearance.  Cardiovascular:     Rate and Rhythm: Normal rate and regular rhythm.     Heart sounds: Normal heart sounds. No murmur.  Pulmonary:     Effort: Pulmonary effort is normal. No respiratory distress.     Breath  sounds: Normal breath sounds. No wheezing.  Skin:    General: Skin is warm and dry.  Neurological:     Mental Status: She is alert and oriented to person, place, and time.  Psychiatric:        Mood and Affect: Mood normal.        Behavior: Behavior normal.        Thought Content: Thought content normal.        Judgment: Judgment normal.      MD Evaluation Airway: WNL Heart: WNL Abdomen: WNL Chest/ Lungs: WNL ASA  Classification: 3 Mallampati/Airway Score: Two   Imaging: Dg Skull Complete  Result Date: 08/29/2019 CLINICAL DATA:  Multiple myeloma.  Follow-up. EXAM: SKULL - COMPLETE 4 + VIEW COMPARISON:  08/23/2019.  Head CT 02/21/2012 FINDINGS: No change since the previous study. Relative lucency in the temporal calvarial region can be seen as a stable finding when compared to a scout radiograph from a head CT dated 02/21/2012. This would be an unusual appearance for myeloma. IMPRESSION: Temporal calvarial lucency, unchanged since a head CT scout image from May of 2013. The appearance would be unusual for myeloma. This is felt to be a normal variant, especially in the absence of any other skeletal findings. Electronically Signed   By: Nelson Chimes M.D.   On: 08/29/2019 16:13   Dg Lumbar Spine 2-3 Views  Result Date: 08/10/2019 CLINICAL DATA:  Status post L1 kyphoplasty. EXAM: DG C-ARM 1-60 MIN; LUMBAR SPINE - 2-3 VIEW CONTRAST:  None. FLUOROSCOPY TIME:  Fluoroscopy Time:  1 minutes 50 seconds. Number of Acquired Spot Images: 2. COMPARISON:  May 26, 2019. FINDINGS: Two intraoperative fluoroscopic images were obtained of the lumbar spine. The patient is status post kyphoplasty of L1 vertebral body. IMPRESSION: Fluoroscopic guidance provided during L1 kyphoplasty. Electronically Signed   By: Marijo Conception M.D.   On: 08/10/2019 12:53   Dg Bone Survey Met  Result Date: 08/23/2019 CLINICAL DATA:  Multiple myeloma. EXAM: METASTATIC BONE SURVEY COMPARISON:  MRI 07/28/2019 head CT  02/21/2012. FINDINGS: Standard imaging of the axial and appendicular skeleton performed. Prominent lucency noted over the temple region on the lateral skull. To determine if this is a true lucency/lesion an AP view of the skull is suggested for further evaluation. Prior L1 vertebroplasty. Prior L4-L5 fusion. Hardware intact. Anatomic alignment. Thoracolumbar spine scoliosis. Diffuse osteopenia and degenerative change noted throughout the cervical, thoracic, and lumbar spine. No focal appendicular bony lesions identified. Chest x-ray is unremarkable. IMPRESSION: 1. Prominent lucency noted over the temple region on the lateral skull view. To determine if this is a true lucency/lesion an AP view of the skull is suggested for further evaluation. 2.  No other focal abnormality identified. Electronically Signed   By: Marcello Moores  Register   On: 08/23/2019 13:21   Dg C-arm 1-60 Min  Result Date: 08/10/2019 CLINICAL DATA:  Status post L1 kyphoplasty. EXAM: DG C-ARM 1-60 MIN; LUMBAR SPINE - 2-3 VIEW CONTRAST:  None. FLUOROSCOPY TIME:  Fluoroscopy Time:  1 minutes 50 seconds. Number of Acquired Spot Images: 2. COMPARISON:  May 26, 2019. FINDINGS: Two intraoperative fluoroscopic images were obtained of the lumbar spine. The patient is status post kyphoplasty of L1 vertebral body. IMPRESSION: Fluoroscopic guidance provided during L1 kyphoplasty. Electronically Signed   By: Marijo Conception M.D.   On: 08/10/2019 12:53    Labs:  CBC: Recent Labs    08/08/19 1443 08/23/19 0951 09/01/19 0745  WBC 6.1 5.6 7.2  HGB 12.6 11.7* 12.9  HCT 38.2 35.1* 37.1  PLT 256 248 277    COAGS: Recent Labs    09/01/19 0745  INR 1.0    BMP: Recent Labs    08/08/19 1443 08/23/19 0951  NA 135 138  K 4.0 4.0  CL 104 105  CO2 23 21*  GLUCOSE 104* 118*  BUN 12 10  CALCIUM 9.3 9.4  CREATININE 0.65 0.80  GFRNONAA >60 >60  GFRAA >60 >60    LIVER FUNCTION TESTS: Recent Labs    08/08/19 1443 08/23/19 0951  BILITOT  0.7 0.4  AST 38 29  ALT 46* 45*  ALKPHOS 95 102  PROT 8.2* 8.6*  ALBUMIN 4.2 4.2  Assessment and Plan:  Newly diagnosed multiple myeloma in need of staging. Plan for image-guided bone marrow biopsy/aspiration today in IR. Patient is NPO. Afebrile and WBCs WNL. She does not take blood thinners. INR 1.0 today.  Risks and benefits discussed with the patient including, but not limited to bleeding, infection, damage to adjacent structures or low yield requiring additional tests. All of the patient's questions were answered, patient is agreeable to proceed. Consent signed and in chart.   Thank you for this interesting consult.  I greatly enjoyed meeting Katrina Cunningham and look forward to participating in their care.  A copy of this report was sent to the requesting provider on this date.  Electronically Signed: Earley Abide, PA-C 09/01/2019, 8:35 AM   I spent a total of 40 Minutes in face to face in clinical consultation, greater than 50% of which was counseling/coordinating care for multiple myeloma/bone marrow biopsy and aspiration.

## 2019-09-01 NOTE — Procedures (Signed)
Pre-procedure Diagnosis: Multiple Myeloma Post-procedure Diagnosis: Same  Technically successful CT guided bone marrow aspiration and biopsy of left iliac crest.   Complications: None Immediate  EBL: None  Signed: Sandi Mariscal Pager: [phone] 09/01/2019, 9:25 AM

## 2019-09-02 ENCOUNTER — Inpatient Hospital Stay (HOSPITAL_BASED_OUTPATIENT_CLINIC_OR_DEPARTMENT_OTHER): Payer: BC Managed Care – PPO | Admitting: Nurse Practitioner

## 2019-09-02 ENCOUNTER — Encounter: Payer: Self-pay | Admitting: Oncology

## 2019-09-02 ENCOUNTER — Other Ambulatory Visit: Payer: Self-pay

## 2019-09-02 ENCOUNTER — Inpatient Hospital Stay: Payer: BC Managed Care – PPO

## 2019-09-02 VITALS — BP 171/91 | HR 108 | Temp 98.2°F | Resp 18 | Ht 66.0 in | Wt 180.5 lb

## 2019-09-02 DIAGNOSIS — Z5112 Encounter for antineoplastic immunotherapy: Secondary | ICD-10-CM | POA: Diagnosis not present

## 2019-09-02 DIAGNOSIS — C9000 Multiple myeloma not having achieved remission: Secondary | ICD-10-CM | POA: Diagnosis not present

## 2019-09-02 MED ORDER — PROCHLORPERAZINE MALEATE 10 MG PO TABS
ORAL_TABLET | ORAL | Status: AC
  Filled 2019-09-02: qty 1

## 2019-09-02 MED ORDER — DEXAMETHASONE 4 MG PO TABS
40.0000 mg | ORAL_TABLET | Freq: Once | ORAL | Status: AC
  Administered 2019-09-02: 40 mg via ORAL

## 2019-09-02 MED ORDER — DEXAMETHASONE 4 MG PO TABS
ORAL_TABLET | ORAL | Status: AC
  Filled 2019-09-02: qty 10

## 2019-09-02 MED ORDER — PROCHLORPERAZINE MALEATE 10 MG PO TABS
10.0000 mg | ORAL_TABLET | Freq: Once | ORAL | Status: AC
  Administered 2019-09-02: 10 mg via ORAL

## 2019-09-02 MED ORDER — BORTEZOMIB CHEMO SQ INJECTION 3.5 MG (2.5MG/ML)
1.3000 mg/m2 | Freq: Once | INTRAMUSCULAR | Status: AC
  Administered 2019-09-02: 2.5 mg via SUBCUTANEOUS
  Filled 2019-09-02: qty 1

## 2019-09-02 NOTE — Progress Notes (Signed)
[place] to f/u on status of Revlimid. Was informed the had reached out to patient without success. Provided patient phone # for pharmacy to call to make arrangements for delivery.

## 2019-09-02 NOTE — Progress Notes (Addendum)
Katrina Cunningham  INTERVAL HISTORY:   Katrina Cunningham returns as scheduled.  She continues to have low back pain but notes that today it is better.  She is trying not to take pain medication due to the sedation it causes.  Bowels are moving.  She has occasional nausea.  No vomiting.  Objective:  Vital signs in last 24 hours:  Blood pressure (!) 171/91, pulse (!) 108, temperature 98.2 F (36.8 C), temperature source Temporal, resp. rate 18, height 5' 6" (1.676 m), weight 180 lb 8 oz (81.9 kg), SpO2 100 %.    GI: Abdomen soft and nontender.  No hepatosplenomegaly. Vascular: No leg edema.  Calves soft and nontender. Neuro: Alert and oriented. Skin: No rash.   Lab Results:  Lab Results  Component Value Date   WBC 7.2 09/01/2019   HGB 12.9 09/01/2019   HCT 37.1 09/01/2019   MCV 104.2 (H) 09/01/2019   PLT 277 09/01/2019   NEUTROABS 3.4 09/01/2019    Imaging:  Ct Biopsy  Result Date: 09/01/2019 INDICATION: Recent diagnosis of multiple Cunningham. Please perform CT-guided bone marrow biopsy for tissue diagnostic purposes. EXAM: CT-GUIDED BONE MARROW BIOPSY AND ASPIRATION MEDICATIONS: None ANESTHESIA/SEDATION: Fentanyl 100 mcg IV; Versed 3 mg IV Sedation Time: 10 Minutes; The patient was continuously monitored during the procedure by the interventional radiology nurse under my direct supervision. COMPLICATIONS: None immediate. PROCEDURE: Informed consent was obtained from the patient following an explanation of the procedure, risks, benefits and alternatives. The patient understands, agrees and consents for the procedure. All questions were addressed. A time out was performed prior to the initiation of the procedure. The patient was positioned prone and non-contrast localization CT was performed of the pelvis to demonstrate the iliac marrow spaces. The operative site was prepped and draped in the usual sterile fashion. Under  sterile conditions and local anesthesia, a 22 gauge spinal needle was utilized for procedural planning. Next, an 11 gauge coaxial bone biopsy needle was advanced into the left iliac marrow space. Needle position was confirmed with CT imaging. Initially, bone marrow aspiration was performed. Next, a bone marrow biopsy was obtained with the 11 gauge outer bone marrow device. Samples were prepared with the cytotechnologist and deemed adequate. The needle was removed intact. Hemostasis was obtained with compression and a dressing was placed. The patient tolerated the procedure well without immediate post procedural complication. IMPRESSION: Successful CT guided left iliac bone marrow aspiration and core biopsy. Electronically Signed   By: Sandi Mariscal M.D.   On: 09/01/2019 10:18   Ct Bone Marrow Biopsy & Aspiration  Result Date: 09/01/2019 INDICATION: Recent diagnosis of multiple Cunningham. Please perform CT-guided bone marrow biopsy for tissue diagnostic purposes. EXAM: CT-GUIDED BONE MARROW BIOPSY AND ASPIRATION MEDICATIONS: None ANESTHESIA/SEDATION: Fentanyl 100 mcg IV; Versed 3 mg IV Sedation Time: 10 Minutes; The patient was continuously monitored during the procedure by the interventional radiology nurse under my direct supervision. COMPLICATIONS: None immediate. PROCEDURE: Informed consent was obtained from the patient following an explanation of the procedure, risks, benefits and alternatives. The patient understands, agrees and consents for the procedure. All questions were addressed. A time out was performed prior to the initiation of the procedure. The patient was positioned prone and non-contrast localization CT was performed of the pelvis to demonstrate the iliac marrow spaces. The operative site was prepped and draped in the usual sterile fashion. Under sterile conditions and local anesthesia, a 22 gauge spinal needle was  utilized for procedural planning. Next, an 11 gauge coaxial bone biopsy needle was  advanced into the left iliac marrow space. Needle position was confirmed with CT imaging. Initially, bone marrow aspiration was performed. Next, a bone marrow biopsy was obtained with the 11 gauge outer bone marrow device. Samples were prepared with the cytotechnologist and deemed adequate. The needle was removed intact. Hemostasis was obtained with compression and a dressing was placed. The patient tolerated the procedure well without immediate post procedural complication. IMPRESSION: Successful CT guided left iliac bone marrow aspiration and core biopsy. Electronically Signed   By: Sandi Mariscal M.D.   On: 09/01/2019 10:18    Medications: I have reviewed the patient's current medications.  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 MRI lumbar spine 07/28/2019-acute L1 compression fracture without central canal stenosis, enhancing lesions at L3 and L5 ? Bone survey 08/23/2019-prominent lucency over the temporal region on the lateral skull view.  No other focal abnormality identified. ? 08/23/2019 serum kappa light chains 45, serum M spike 1.6, IgG 2154, serum immunofixation-IgG kappa ? Zometa every 3 months beginning 08/26/2019  ? skull x-ray 08/29/2019-temporal calvarial lucency unchanged since a head CT May 2013 felt to be a normal variant. ? Bone marrow biopsy 09/01/2019-preliminary 18% plasma cells ? Cycle 1 RVD 09/02/2019 (anticipate she will begin Revlimid 09/05/2019) 2. Acute L1 pathological pressure fracture, status post a kyphoplasty 08/10/2019  3.Pain secondary #2  4.Tobacco and alcohol use  5.History of multiple back surgeries  6.Anemia secondary to #1  7.Anxiety/depression  Disposition: Katrina Cunningham appears stable.  She had a staging bone marrow biopsy yesterday.  Dr. Benay Spice spoke with Dr. Gari Crown.  Preliminary report shows 18% plasma cells.  She is scheduled to begin cycle 1 RVD  today.  She has not received Revlimid from her pharmacy.  She will contact the pharmacy and make arrangements for shipment.  Anticipate she will begin Revlimid 09/05/2019.  She will receive Velcade and dexamethasone today.  She will return for Velcade/dexamethasone in 1 week.  We will see her in follow-up in 2 weeks.  She will contact the office in the interim with any problems.  Patient seen with Dr. Benay Spice.  Ned Card ANP/GNP-BC   09/02/2019  2:19 PM  This was a shared visit with Ned Card.  We discussed the preliminary bone marrow findings with Katrina Cunningham.  Her clinical presentation is consistent with a diagnosis of multiple Cunningham.  The plan is to begin therapy today.  We are waiting on the Cunningham prognostic panel from the bone marrow biopsy.  She has been referred to Dr. Norma Fredrickson to consider the indication for stem cell therapy.  She will begin aspirin and acyclovir prophylaxis.  Julieanne Manson, MD

## 2019-09-02 NOTE — Patient Instructions (Signed)
Bortezomib injection What is this medicine? BORTEZOMIB (bor TEZ oh mib) is a medicine that targets proteins in cancer cells and stops the cancer cells from growing. It is used to treat multiple myeloma and mantle-cell lymphoma. This medicine may be used for other purposes; ask your health care provider or pharmacist if you have questions. COMMON BRAND NAME(S): Velcade What should I tell my health care provider before I take this medicine? They need to know if you have any of these conditions:  diabetes  heart disease  irregular heartbeat  liver disease  on hemodialysis  low blood counts, like low white blood cells, platelets, or hemoglobin  peripheral neuropathy  taking medicine for blood pressure  an unusual or allergic reaction to bortezomib, mannitol, boron, other medicines, foods, dyes, or preservatives  pregnant or trying to get pregnant  breast-feeding How should I use this medicine? This medicine is for injection into a vein or for injection under the skin. It is given by a health care professional in a hospital or clinic setting. Talk to your pediatrician regarding the use of this medicine in children. Special care may be needed. Overdosage: If you think you have taken too much of this medicine contact a poison control center or emergency room at once. NOTE: This medicine is only for you. Do not share this medicine with others. What if I miss a dose? It is important not to miss your dose. Call your doctor or health care professional if you are unable to keep an appointment. What may interact with this medicine? This medicine may interact with the following medications:  ketoconazole  rifampin  ritonavir  St. John's Wort This list may not describe all possible interactions. Give your health care provider a list of all the medicines, herbs, non-prescription drugs, or dietary supplements you use. Also tell them if you smoke, drink alcohol, or use illegal drugs. Some  items may interact with your medicine. What should I watch for while using this medicine? You may get drowsy or dizzy. Do not drive, use machinery, or do anything that needs mental alertness until you know how this medicine affects you. Do not stand or sit up quickly, especially if you are an older patient. This reduces the risk of dizzy or fainting spells. In some cases, you may be given additional medicines to help with side effects. Follow all directions for their use. Call your doctor or health care professional for advice if you get a fever, chills or sore throat, or other symptoms of a cold or flu. Do not treat yourself. This drug decreases your body's ability to fight infections. Try to avoid being around people who are sick. This medicine may increase your risk to bruise or bleed. Call your doctor or health care professional if you notice any unusual bleeding. You may need blood work done while you are taking this medicine. In some patients, this medicine may cause a serious brain infection that may cause death. If you have any problems seeing, thinking, speaking, walking, or standing, tell your doctor right away. If you cannot reach your doctor, urgently seek other source of medical care. Check with your doctor or health care professional if you get an attack of severe diarrhea, nausea and vomiting, or if you sweat a lot. The loss of too much body fluid can make it dangerous for you to take this medicine. Do not become pregnant while taking this medicine or for at least 7 months after stopping it. Women should inform their doctor   if they wish to become pregnant or think they might be pregnant. Men should not father a child while taking this medicine and for at least 4 months after stopping it. There is a potential for serious side effects to an unborn child. Talk to your health care professional or pharmacist for more information. Do not breast-feed an infant while taking this medicine or for 2  months after stopping it. This medicine may interfere with the ability to have a child. You should talk with your doctor or health care professional if you are concerned about your fertility. What side effects may I notice from receiving this medicine? Side effects that you should report to your doctor or health care professional as soon as possible:  allergic reactions like skin rash, itching or hives, swelling of the face, lips, or tongue  breathing problems  changes in hearing  changes in vision  fast, irregular heartbeat  feeling faint or lightheaded, falls  pain, tingling, numbness in the hands or feet  right upper belly pain  seizures  swelling of the ankles, feet, hands  unusual bleeding or bruising  unusually weak or tired  vomiting  yellowing of the eyes or skin Side effects that usually do not require medical attention (report to your doctor or health care professional if they continue or are bothersome):  changes in emotions or moods  constipation  diarrhea  loss of appetite  headache  irritation at site where injected  nausea This list may not describe all possible side effects. Call your doctor for medical advice about side effects. You may report side effects to FDA at 1-800-FDA-1088. Where should I keep my medicine? This drug is given in a hospital or clinic and will not be stored at home. NOTE: This sheet is a summary. It may not cover all possible information. If you have questions about this medicine, talk to your doctor, pharmacist, or health care provider.  2020 Elsevier/Gold Standard (2018-02-15 16:29:31)  

## 2019-09-02 NOTE — Progress Notes (Signed)
Met with patient at registration to introduce myself as Arboriculturist and to offer available resources.  Discussed one-time $59 Engineer, drilling to assist with personal expenses while going through treatment.  Also advised of available copay assistance for treatment drugs if she has not met her ded/OOP. Advised what is needed to apply for both.  Gave her my card if interested in applying and for any additional financial questions or concerns.

## 2019-09-02 NOTE — Progress Notes (Signed)
Ok to treat per Lattie Haw NP today with CBC from 11/12 and with VS taken at MD visit today

## 2019-09-02 NOTE — Progress Notes (Signed)
Spoke with Ned Card, NP and confirmed that it is OK to proceed with treatment today using patient's CMP from 08/23/19.  Leron Croak, PharmD PGY2 Hematology/Oncology Pharmacy Resident 09/02/2019 2:54 PM

## 2019-09-04 ENCOUNTER — Other Ambulatory Visit: Payer: Self-pay | Admitting: Oncology

## 2019-09-05 ENCOUNTER — Telehealth: Payer: Self-pay | Admitting: *Deleted

## 2019-09-05 ENCOUNTER — Encounter: Payer: Self-pay | Admitting: Nurse Practitioner

## 2019-09-05 ENCOUNTER — Encounter: Payer: Self-pay | Admitting: *Deleted

## 2019-09-05 LAB — SURGICAL PATHOLOGY

## 2019-09-05 NOTE — Progress Notes (Signed)
Patient dropped of STD paperwork at front desk. Forwarded to disability specialist inbox.

## 2019-09-06 ENCOUNTER — Telehealth: Payer: Self-pay | Admitting: Oncology

## 2019-09-06 NOTE — Telephone Encounter (Signed)
Oral Oncology Pharmacist Encounter  Received notification from [facility] that Revlimid 25 mg capsule prescription shipped on 09/05/2019.  Johny Drilling, PharmD, BCPS, BCOP  09/06/2019 10:06 AM Oral Oncology Clinic [phone]

## 2019-09-06 NOTE — Telephone Encounter (Signed)
FAXED RECORDS TO DR Norma Fredrickson AT [facility]  [phone]

## 2019-09-07 ENCOUNTER — Encounter: Payer: Self-pay | Admitting: Nurse Practitioner

## 2019-09-08 ENCOUNTER — Other Ambulatory Visit: Payer: Self-pay | Admitting: *Deleted

## 2019-09-08 DIAGNOSIS — C9000 Multiple myeloma not having achieved remission: Secondary | ICD-10-CM

## 2019-09-09 ENCOUNTER — Other Ambulatory Visit: Payer: Self-pay

## 2019-09-09 ENCOUNTER — Inpatient Hospital Stay: Payer: BC Managed Care – PPO

## 2019-09-09 ENCOUNTER — Encounter: Payer: Self-pay | Admitting: Nurse Practitioner

## 2019-09-09 VITALS — BP 144/77 | HR 69 | Temp 98.2°F | Resp 18

## 2019-09-09 DIAGNOSIS — Z5112 Encounter for antineoplastic immunotherapy: Secondary | ICD-10-CM | POA: Diagnosis not present

## 2019-09-09 DIAGNOSIS — C9000 Multiple myeloma not having achieved remission: Secondary | ICD-10-CM

## 2019-09-09 LAB — CMP (CANCER CENTER ONLY)
ALT: 82 U/L — ABNORMAL HIGH (ref 0–44)
AST: 56 U/L — ABNORMAL HIGH (ref 15–41)
Albumin: 4 g/dL (ref 3.5–5.0)
Alkaline Phosphatase: 99 U/L (ref 38–126)
Anion gap: 8 (ref 5–15)
BUN: 17 mg/dL (ref 8–23)
CO2: 23 mmol/L (ref 22–32)
Calcium: 8.9 mg/dL (ref 8.9–10.3)
Chloride: 105 mmol/L (ref 98–111)
Creatinine: 0.78 mg/dL (ref 0.44–1.00)
GFR, Est AFR Am: >60 mL/min (ref >60)
GFR, Estimated: >60 mL/min (ref >60)
Glucose, Bld: 93 mg/dL (ref 70–99)
Potassium: 4 mmol/L (ref 3.5–5.1)
Sodium: 136 mmol/L (ref 135–145)
Total Bilirubin: 0.5 mg/dL (ref 0.3–1.2)
Total Protein: 8 g/dL (ref 6.5–8.1)

## 2019-09-09 LAB — CBC WITH DIFFERENTIAL (CANCER CENTER ONLY)
Abs Immature Granulocytes: 0.13 10*3/uL — ABNORMAL HIGH (ref 0.00–0.07)
Basophils Absolute: 0 10*3/uL (ref 0.0–0.1)
Basophils Relative: 1 %
Eosinophils Absolute: 0.2 10*3/uL (ref 0.0–0.5)
Eosinophils Relative: 3 %
HCT: 34.5 % — ABNORMAL LOW (ref 36.0–46.0)
Hemoglobin: 11.5 g/dL — ABNORMAL LOW (ref 12.0–15.0)
Immature Granulocytes: 2 %
Lymphocytes Relative: 33 %
Lymphs Abs: 2 10*3/uL (ref 0.7–4.0)
MCH: 35 pg — ABNORMAL HIGH (ref 26.0–34.0)
MCHC: 33.3 g/dL (ref 30.0–36.0)
MCV: 104.9 fL — ABNORMAL HIGH (ref 80.0–100.0)
Monocytes Absolute: 0.6 10*3/uL (ref 0.1–1.0)
Monocytes Relative: 9 %
Neutro Abs: 3.2 10*3/uL (ref 1.7–7.7)
Neutrophils Relative %: 52 %
Platelet Count: 215 10*3/uL (ref 150–400)
RBC: 3.29 MIL/uL — ABNORMAL LOW (ref 3.87–5.11)
RDW: 13.1 % (ref 11.5–15.5)
WBC Count: 6.1 10*3/uL (ref 4.0–10.5)
nRBC: 0 % (ref 0.0–0.2)

## 2019-09-09 MED ORDER — DEXAMETHASONE 4 MG PO TABS
ORAL_TABLET | ORAL | Status: AC
  Filled 2019-09-09: qty 5

## 2019-09-09 MED ORDER — DEXAMETHASONE 4 MG PO TABS
ORAL_TABLET | ORAL | Status: AC
  Filled 2019-09-09: qty 3

## 2019-09-09 MED ORDER — DEXAMETHASONE 4 MG PO TABS
40.0000 mg | ORAL_TABLET | Freq: Once | ORAL | Status: AC
  Administered 2019-09-09: 40 mg via ORAL

## 2019-09-09 MED ORDER — PROCHLORPERAZINE MALEATE 10 MG PO TABS
10.0000 mg | ORAL_TABLET | Freq: Once | ORAL | Status: AC
  Administered 2019-09-09: 10 mg via ORAL

## 2019-09-09 MED ORDER — PROCHLORPERAZINE MALEATE 10 MG PO TABS
ORAL_TABLET | ORAL | Status: AC
  Filled 2019-09-09: qty 1

## 2019-09-09 MED ORDER — BORTEZOMIB CHEMO SQ INJECTION 3.5 MG (2.5MG/ML)
1.3000 mg/m2 | Freq: Once | INTRAMUSCULAR | Status: AC
  Administered 2019-09-09: 2.5 mg via SUBCUTANEOUS
  Filled 2019-09-09: qty 1

## 2019-09-09 NOTE — Patient Instructions (Signed)
[facility] Discharge Instructions for Patients Receiving Chemotherapy  Today you received the following chemotherapy agents Velcade.  To help prevent nausea and vomiting after your treatment, we encourage you to take your nausea medication as directed.  If you develop nausea and vomiting that is not controlled by your nausea medication, call the clinic.   BELOW ARE SYMPTOMS THAT SHOULD BE REPORTED IMMEDIATELY:  *FEVER GREATER THAN 100.5 F  *CHILLS WITH OR WITHOUT FEVER  NAUSEA AND VOMITING THAT IS NOT CONTROLLED WITH YOUR NAUSEA MEDICATION  *UNUSUAL SHORTNESS OF BREATH  *UNUSUAL BRUISING OR BLEEDING  TENDERNESS IN MOUTH AND THROAT WITH OR WITHOUT PRESENCE OF ULCERS  *URINARY PROBLEMS  *BOWEL PROBLEMS  UNUSUAL RASH Items with * indicate a potential emergency and should be followed up as soon as possible.  Feel free to call the clinic should you have any questions or concerns. The clinic phone number is [phone].  Please show the CHEMO ALERT CARD at check-in to the Emergency Department and triage nurse.   

## 2019-09-11 ENCOUNTER — Other Ambulatory Visit: Payer: Self-pay | Admitting: Oncology

## 2019-09-12 ENCOUNTER — Encounter: Payer: Self-pay | Admitting: Oncology

## 2019-09-12 ENCOUNTER — Encounter (HOSPITAL_COMMUNITY): Payer: Self-pay | Admitting: Oncology

## 2019-09-14 ENCOUNTER — Other Ambulatory Visit: Payer: Self-pay | Admitting: *Deleted

## 2019-09-14 DIAGNOSIS — C9000 Multiple myeloma not having achieved remission: Secondary | ICD-10-CM

## 2019-09-14 NOTE — Progress Notes (Signed)
Confirmed with Dr. Benay Spice: Only needs CMP monthly for velcade.

## 2019-09-16 ENCOUNTER — Other Ambulatory Visit: Payer: Self-pay | Admitting: *Deleted

## 2019-09-16 ENCOUNTER — Inpatient Hospital Stay: Payer: BC Managed Care – PPO

## 2019-09-16 ENCOUNTER — Other Ambulatory Visit: Payer: Self-pay

## 2019-09-16 ENCOUNTER — Inpatient Hospital Stay (HOSPITAL_BASED_OUTPATIENT_CLINIC_OR_DEPARTMENT_OTHER): Payer: BC Managed Care – PPO | Admitting: Oncology

## 2019-09-16 ENCOUNTER — Encounter: Payer: Self-pay | Admitting: *Deleted

## 2019-09-16 VITALS — BP 132/77 | HR 78 | Temp 98.2°F | Resp 18 | Ht 66.0 in | Wt 176.8 lb

## 2019-09-16 DIAGNOSIS — C9000 Multiple myeloma not having achieved remission: Secondary | ICD-10-CM

## 2019-09-16 DIAGNOSIS — Z5112 Encounter for antineoplastic immunotherapy: Secondary | ICD-10-CM | POA: Diagnosis not present

## 2019-09-16 LAB — CBC WITH DIFFERENTIAL (CANCER CENTER ONLY)
Abs Immature Granulocytes: 0.09 10*3/uL — ABNORMAL HIGH (ref 0.00–0.07)
Basophils Absolute: 0 10*3/uL (ref 0.0–0.1)
Basophils Relative: 1 %
Eosinophils Absolute: 0.2 10*3/uL (ref 0.0–0.5)
Eosinophils Relative: 3 %
HCT: 34.4 % — ABNORMAL LOW (ref 36.0–46.0)
Hemoglobin: 11.6 g/dL — ABNORMAL LOW (ref 12.0–15.0)
Immature Granulocytes: 2 %
Lymphocytes Relative: 36 %
Lymphs Abs: 2.2 10*3/uL (ref 0.7–4.0)
MCH: 34.4 pg — ABNORMAL HIGH (ref 26.0–34.0)
MCHC: 33.7 g/dL (ref 30.0–36.0)
MCV: 102.1 fL — ABNORMAL HIGH (ref 80.0–100.0)
Monocytes Absolute: 0.4 10*3/uL (ref 0.1–1.0)
Monocytes Relative: 6 %
Neutro Abs: 3.3 10*3/uL (ref 1.7–7.7)
Neutrophils Relative %: 52 %
Platelet Count: 174 10*3/uL (ref 150–400)
RBC: 3.37 MIL/uL — ABNORMAL LOW (ref 3.87–5.11)
RDW: 13.3 % (ref 11.5–15.5)
WBC Count: 6.2 10*3/uL (ref 4.0–10.5)
nRBC: 0 % (ref 0.0–0.2)

## 2019-09-16 MED ORDER — BORTEZOMIB CHEMO SQ INJECTION 3.5 MG (2.5MG/ML)
1.3000 mg/m2 | Freq: Once | INTRAMUSCULAR | Status: AC
  Administered 2019-09-16: 2.5 mg via SUBCUTANEOUS
  Filled 2019-09-16: qty 1

## 2019-09-16 MED ORDER — DEXAMETHASONE 4 MG PO TABS
ORAL_TABLET | ORAL | Status: AC
  Filled 2019-09-16: qty 10

## 2019-09-16 MED ORDER — PROCHLORPERAZINE MALEATE 10 MG PO TABS
ORAL_TABLET | ORAL | Status: AC
  Filled 2019-09-16: qty 1

## 2019-09-16 MED ORDER — PROCHLORPERAZINE MALEATE 10 MG PO TABS
10.0000 mg | ORAL_TABLET | Freq: Once | ORAL | Status: AC
  Administered 2019-09-16: 10 mg via ORAL

## 2019-09-16 MED ORDER — DEXAMETHASONE 4 MG PO TABS
40.0000 mg | ORAL_TABLET | Freq: Once | ORAL | Status: AC
  Administered 2019-09-16: 40 mg via ORAL

## 2019-09-16 NOTE — Progress Notes (Signed)
  [facility] OFFICE PROGRESS NOTE   Diagnosis: Multiple myeloma  INTERVAL HISTORY:   Katrina Cunningham returns for a scheduled visit.  She began Velcade on 09/02/2019.  She started Revlimid 09/07/2019.  She reports diarrhea 3-4 times per day for the past several days.  She has not tried them.  She otherwise feels well.  Back pain has improved.  Objective:  Vital signs in last 24 hours:  Blood pressure 132/77, pulse 78, temperature 98.2 F (36.8 C), temperature source Temporal, resp. rate 18, height _0  (1.676 m), weight 176 lb 12.8 oz (80.2 kg), SpO2 100 %.    HEENT: Small 2-3 mm, ulcerations at the left buccal mucosa, no thrush GI: No hepatomegaly, nontender Vascular: No leg edema   Lab Results:  Lab Results  Component Value Date   WBC 6.2 09/16/2019   HGB 11.6 (L) 09/16/2019   HCT 34.4 (L) 09/16/2019   MCV 102.1 (H) 09/16/2019   PLT 174 09/16/2019   NEUTROABS 3.3 09/16/2019    CMP  Lab Results  Component Value Date   NA 136 09/09/2019   K 4.0 09/09/2019   CL 105 09/09/2019   CO2 23 09/09/2019   GLUCOSE 93 09/09/2019   BUN 17 09/09/2019   CREATININE 0.78 09/09/2019   CALCIUM 8.9 09/09/2019   PROT 8.0 09/09/2019   ALBUMIN 4.0 09/09/2019   AST 56 (H) 09/09/2019   ALT 82 (H) 09/09/2019   ALKPHOS 99 09/09/2019   BILITOT 0.5 09/09/2019   GFRNONAA >60 09/09/2019   GFRAA >60 09/09/2019     Medications: I have reviewed the patient's current medications.   Assessment/Plan: 1. Multiple myeloma-IgG kappa ? 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 MRI lumbar spine 07/28/2019-acute L1 compression fracture without central canal stenosis, enhancing lesions at L3 and L5 ? Bone survey 08/23/2019-prominent lucency over the temporal region on the lateral skull view.  No other focal abnormality identified. ? 08/23/2019 serum kappa light chains 45, serum M spike 1.6, IgG 2154, serum immunofixation-IgG kappa ? Zometa every 3  months beginning 08/26/2019  ? skull x-ray 08/29/2019-temporal calvarial lucency unchanged since a head CT May 2013 felt to be a normal variant. ? Bone marrow biopsy 09/01/2019-preliminary 18% plasma cells, FISH panel-suggestive of gain of CCND1/11q13 or trisomy 11.  Cytogenetics-40 6XX ? Cycle 1 RVD 09/02/2019 (anticipate she will begin Revlimid 09/07/2019) 2. Acute L1 pathological pressure fracture, status post a kyphoplasty 08/10/2019  3.Pain secondary #2  4.Tobacco and alcohol use  5.History of multiple back surgeries  6.Anemia secondary to #1  7.Anxiety/depression   Disposition: [name] is completing the first cycle of RVD.  She has developed diarrhea likely secondary to Revlimid.  She is otherwise tolerating the treatment well.  The Revlimid start was delayed with cycle 1.  We will push cycle 2 back to 09/29/2019 in order to get the Revlimid and Velcade on the same schedule.  She will be seen for an office visit on 09/29/2019.  Katrina Cunningham will try Imodium for diarrhea.  She will contact us if Imodium does not help with the diarrhea.  Betsy Coder, MD  09/16/2019  11:35 AM

## 2019-09-16 NOTE — Patient Instructions (Signed)
[facility] Discharge Instructions for Patients Receiving Chemotherapy  Today you received the following chemotherapy agents Velcade.  To help prevent nausea and vomiting after your treatment, we encourage you to take your nausea medication as directed.  If you develop nausea and vomiting that is not controlled by your nausea medication, call the clinic.   BELOW ARE SYMPTOMS THAT SHOULD BE REPORTED IMMEDIATELY:  *FEVER GREATER THAN 100.5 F  *CHILLS WITH OR WITHOUT FEVER  NAUSEA AND VOMITING THAT IS NOT CONTROLLED WITH YOUR NAUSEA MEDICATION  *UNUSUAL SHORTNESS OF BREATH  *UNUSUAL BRUISING OR BLEEDING  TENDERNESS IN MOUTH AND THROAT WITH OR WITHOUT PRESENCE OF ULCERS  *URINARY PROBLEMS  *BOWEL PROBLEMS  UNUSUAL RASH Items with * indicate a potential emergency and should be followed up as soon as possible.  Feel free to call the clinic should you have any questions or concerns. The clinic phone number is [phone].  Please show the CHEMO ALERT CARD at check-in to the Emergency Department and triage nurse.   

## 2019-09-16 NOTE — Progress Notes (Signed)
Ok to treat with 11/20 CMET per Md Crosby today.

## 2019-09-19 ENCOUNTER — Telehealth: Payer: Self-pay | Admitting: Oncology

## 2019-09-19 NOTE — Telephone Encounter (Signed)
Scheduled per los. Called and left msg. mailed printout  

## 2019-09-20 ENCOUNTER — Other Ambulatory Visit: Payer: Self-pay | Admitting: Oncology

## 2019-09-20 DIAGNOSIS — C9000 Multiple myeloma not having achieved remission: Secondary | ICD-10-CM

## 2019-09-21 ENCOUNTER — Telehealth: Payer: Self-pay | Admitting: *Deleted

## 2019-09-21 MED ORDER — OMEPRAZOLE 20 MG PO CPDR: 20.0000 mg | DELAYED_RELEASE_CAPSULE | Freq: Every day | ORAL | 0 refills | Status: AC

## 2019-09-21 NOTE — Telephone Encounter (Addendum)
Reporting sensation of "something stuck in my throat" at epigastric region. Feels need to belch, but difficult to do so. When she finally does so, a thick phlegm will come up. Also reports feeling more short of breath recently and it is worse when she is lying down. Sleeping with head elevated. Denies cough, congestion or fever. Also reporting her pain level has increased-she can manage it with meds, but wanted MD aware. Today is last dose of her Revlimid cycle. Per Dr. Benay Spice: May try omeprazole 20 mg daily by script or OTC. Left VM w/patient that script will be sent and she can decide at pharmacy which will cost her less.

## 2019-09-25 ENCOUNTER — Other Ambulatory Visit: Payer: Self-pay | Admitting: Oncology

## 2019-09-29 ENCOUNTER — Telehealth: Payer: Self-pay | Admitting: *Deleted

## 2019-09-29 ENCOUNTER — Inpatient Hospital Stay: Payer: BC Managed Care – PPO

## 2019-09-29 ENCOUNTER — Telehealth: Payer: Self-pay | Admitting: Nurse Practitioner

## 2019-09-29 ENCOUNTER — Inpatient Hospital Stay: Payer: BC Managed Care – PPO | Admitting: Nurse Practitioner

## 2019-09-29 NOTE — Telephone Encounter (Signed)
Beginning last night, she thinks she has a sinus infection. Coughing up yellow sputum w/some occasional expiratory wheezes and mild shortness of breath. Reports no fever, but she has not taken her temp. Instructed her not to come in for treatment this week. Take whatever OTC meds she has used in past for sinus issues, take an expectorant for cough and push po fluids. Monitor temp and go to ER if her condition worsens. Encouraged her to self-isolate until symptoms resolve. Per Dr. Benay Spice: OK to continue Revlimid. Will reschedule treatment for next week.

## 2019-09-29 NOTE — Telephone Encounter (Signed)
R/s appt per 12/10 sch message to add MD appt - pt aware of appt date and time

## 2019-10-02 ENCOUNTER — Other Ambulatory Visit: Payer: Self-pay | Admitting: Oncology

## 2019-10-05 ENCOUNTER — Telehealth: Payer: Self-pay | Admitting: *Deleted

## 2019-10-05 NOTE — Telephone Encounter (Signed)
Patient had told tech at travel screen call that she has cough and shortness of breath. This RN discussed w/her on 12/10 and she feels she is a little worse. Also has developed diarrhea. Instructed her to not come to office tomorrow. Provided # to call to make appointment for COVID test. See PCP or urgent care if her symptoms worsen. She understands and agrees. Scheduling message sent to set up lab/OV for 12/24 chemo appointment.

## 2019-10-06 ENCOUNTER — Other Ambulatory Visit: Payer: Self-pay

## 2019-10-06 ENCOUNTER — Inpatient Hospital Stay: Payer: BC Managed Care – PPO | Admitting: Nurse Practitioner

## 2019-10-06 ENCOUNTER — Ambulatory Visit: Payer: BC Managed Care – PPO

## 2019-10-06 ENCOUNTER — Inpatient Hospital Stay: Payer: BC Managed Care – PPO

## 2019-10-06 ENCOUNTER — Telehealth: Payer: Self-pay | Admitting: Oncology

## 2019-10-06 ENCOUNTER — Emergency Department (HOSPITAL_COMMUNITY): Payer: BC Managed Care – PPO

## 2019-10-06 ENCOUNTER — Encounter (HOSPITAL_COMMUNITY): Payer: Self-pay

## 2019-10-06 ENCOUNTER — Telehealth: Payer: Self-pay | Admitting: *Deleted

## 2019-10-06 ENCOUNTER — Emergency Department (HOSPITAL_COMMUNITY)
Admission: EM | Admit: 2019-10-06 | Discharge: 2019-10-06 | Disposition: A | Discharge status: Home / Self Care | Payer: BC Managed Care – PPO | Attending: Emergency Medicine | Admitting: Emergency Medicine

## 2019-10-06 DIAGNOSIS — F1210 Cannabis abuse, uncomplicated: Secondary | ICD-10-CM | POA: Insufficient documentation

## 2019-10-06 DIAGNOSIS — C9000 Multiple myeloma not having achieved remission: Secondary | ICD-10-CM | POA: Diagnosis not present

## 2019-10-06 DIAGNOSIS — I10 Essential (primary) hypertension: Secondary | ICD-10-CM | POA: Diagnosis not present

## 2019-10-06 DIAGNOSIS — R197 Diarrhea, unspecified: Secondary | ICD-10-CM | POA: Insufficient documentation

## 2019-10-06 DIAGNOSIS — Z79899 Other long term (current) drug therapy: Secondary | ICD-10-CM | POA: Diagnosis not present

## 2019-10-06 DIAGNOSIS — U071 COVID-19: Secondary | ICD-10-CM | POA: Diagnosis not present

## 2019-10-06 DIAGNOSIS — Z7982 Long term (current) use of aspirin: Secondary | ICD-10-CM | POA: Diagnosis not present

## 2019-10-06 DIAGNOSIS — F17210 Nicotine dependence, cigarettes, uncomplicated: Secondary | ICD-10-CM | POA: Insufficient documentation

## 2019-10-06 DIAGNOSIS — E039 Hypothyroidism, unspecified: Secondary | ICD-10-CM | POA: Insufficient documentation

## 2019-10-06 DIAGNOSIS — Z9221 Personal history of antineoplastic chemotherapy: Secondary | ICD-10-CM | POA: Diagnosis not present

## 2019-10-06 DIAGNOSIS — B349 Viral infection, unspecified: Secondary | ICD-10-CM

## 2019-10-06 DIAGNOSIS — R0602 Shortness of breath: Secondary | ICD-10-CM | POA: Diagnosis present

## 2019-10-06 HISTORY — DX: Multiple myeloma not having achieved remission: C90.00

## 2019-10-06 HISTORY — DX: Malignant (primary) neoplasm, unspecified: C80.1

## 2019-10-06 LAB — COMPREHENSIVE METABOLIC PANEL
ALT: 30 U/L (ref 0–44)
AST: 30 U/L (ref 15–41)
Albumin: 3.5 g/dL (ref 3.5–5.0)
Alkaline Phosphatase: 83 U/L (ref 38–126)
Anion gap: 9 (ref 5–15)
BUN: 9 mg/dL (ref 8–23)
CO2: 24 mmol/L (ref 22–32)
Calcium: 8.6 mg/dL — ABNORMAL LOW (ref 8.9–10.3)
Chloride: 100 mmol/L (ref 98–111)
Creatinine, Ser: 0.9 mg/dL (ref 0.44–1.00)
GFR calc Af Amer: >60 mL/min (ref >60)
GFR calc non Af Amer: >60 mL/min (ref >60)
Glucose, Bld: 104 mg/dL — ABNORMAL HIGH (ref 70–99)
Potassium: 3.6 mmol/L (ref 3.5–5.1)
Sodium: 133 mmol/L — ABNORMAL LOW (ref 135–145)
Total Bilirubin: 0.7 mg/dL (ref 0.3–1.2)
Total Protein: 7.2 g/dL (ref 6.5–8.1)

## 2019-10-06 LAB — CBC
HCT: 34.7 % — ABNORMAL LOW (ref 36.0–46.0)
Hemoglobin: 11.7 g/dL — ABNORMAL LOW (ref 12.0–15.0)
MCH: 35.3 pg — ABNORMAL HIGH (ref 26.0–34.0)
MCHC: 33.7 g/dL (ref 30.0–36.0)
MCV: 104.8 fL — ABNORMAL HIGH (ref 80.0–100.0)
Platelets: 148 10*3/uL — ABNORMAL LOW (ref 150–400)
RBC: 3.31 MIL/uL — ABNORMAL LOW (ref 3.87–5.11)
RDW: 12.9 % (ref 11.5–15.5)
WBC: 4.9 10*3/uL (ref 4.0–10.5)
nRBC: 0 % (ref 0.0–0.2)

## 2019-10-06 LAB — TROPONIN I (HIGH SENSITIVITY): Troponin I (High Sensitivity): 6 ng/L (ref <18)

## 2019-10-06 LAB — SARS CORONAVIRUS 2 (TAT 6-24 HRS): SARS Coronavirus 2: POSITIVE — AB

## 2019-10-06 LAB — POC SARS CORONAVIRUS 2 AG -  ED: SARS Coronavirus 2 Ag: NEGATIVE

## 2019-10-06 LAB — LIPASE, BLOOD: Lipase: 42 U/L (ref 11–51)

## 2019-10-06 LAB — BRAIN NATRIURETIC PEPTIDE: B Natriuretic Peptide: 47 pg/mL (ref 0.0–100.0)

## 2019-10-06 LAB — D-DIMER, QUANTITATIVE: D-Dimer, Quant: 0.58 ug/mL-FEU — ABNORMAL HIGH (ref 0.00–0.50)

## 2019-10-06 NOTE — Telephone Encounter (Signed)
Scheduled appt per 12/16 sch message - pt aware of appt date and time

## 2019-10-06 NOTE — Discharge Instructions (Signed)
If you develop chest pain, shortness of breath, fever, vomiting, abdominal or back pain, or any other new/concerning symptoms then return to the ER for evaluation.  °

## 2019-10-06 NOTE — Telephone Encounter (Signed)
Patient called with concerns her blood pressure is too high. She does not have a BP cuff at home and no way to check it. She reports increased SOB and a "swishy" dizzy feeling when she moves even slightly. Patient denies chest pain, numbness. She states she feels horrible all over and knows it is from her blood pressure.   Per Dr. Benay Spice- COVID testing is scheduled for tomorrow, patient should report to [facility] ED for evaluation and testing today. Patient notified and states she will have her daughter bring her.

## 2019-10-06 NOTE — ED Triage Notes (Signed)
Pt reports hx of multiple myeloma. Pt states that she was started on a new chemo medication. Pt reports high BP this morning but WDL in triage. Pt now reports SHOB that started about 5 days ago. Pt reports diarrhea since starting new medication.  

## 2019-10-06 NOTE — ED Provider Notes (Signed)
[facility] DEPT Provider Note   CSN: [id number] Arrival date & time: 10/06/19  1218     History Chief Complaint  Patient presents with  . Shortness of Breath  . Diarrhea  . Cancer Patient    Katrina Cunningham is a 63 y.o. female.  HPI  63 year old female with multiple myeloma presents with shortness of breath and concern her blood pressure is high.  Shortness of breath has been for about 5 days.  Has also had cough and congestion.  No chest pain.  Feels "swishy" for 2 days and thinks that her blood pressure is high.  Kind of like lightheadedness but has not had any passing out.  No leg swelling.  Has diarrhea but this is not new.  Was told to come to the ER for Covid testing.  Past Medical History:  Diagnosis Date  . Anxiety   . Cancer ([place])   . Depression   . Hyperlipidemia   . Hypertension   . Hypothyroidism   . Multiple myeloma ([place])   . Substance abuse ([place])    smokes marijuana daily  . Thyroid disease     Patient Active Problem List   Diagnosis Date Noted  . Multiple myeloma ([place]) 08/23/2019  . Goals of care, counseling/discussion 08/23/2019  . Vitamin D deficiency 04/15/2019  . Current moderate episode of major depressive disorder without prior episode ([place]) 04/13/2019  . Hypertension 02/24/2018  . Hyperlipidemia 02/24/2018  . Hypothyroidism 09/16/2017    Past Surgical History:  Procedure Laterality Date  . BACK SURGERY    . KYPHOPLASTY N/A 08/10/2019   Procedure: KYPHOPLASTY LUMBAR ONE WITH BIOPSY;  Surgeon: Jovita Gamma, MD;  Location: [facility];  Service: Neurosurgery;  Laterality: N/A;     OB History    Gravida  2   Para  2   Term  2   Preterm      AB      Living  2     SAB      TAB      Ectopic      Multiple      Live Births              Family History  Problem Relation Age of Onset  . Breast cancer Mother   . Thyroid disease Mother     Social History   Tobacco Use  . Smoking status:  Current Every Day Smoker    Packs/day: 0.15    Years: 20.00    Pack years: 3.00    Types: Cigarettes  . Smokeless tobacco: Never Used  Substance Use Topics  . Alcohol use: Yes    Comment: 30 glasses of wine per week  . Drug use: Yes    Types: Marijuana    Comment: uses marijuana daily - last use 08/08/19    Home Medications Prior to Admission medications   Medication Sig Start Date End Date Taking? Authorizing Provider  acyclovir (ZOVIRAX) 400 MG tablet Take 1 tablet (400 mg total) by mouth 2 (two) times daily. 08/26/19  Yes Owens Shark, NP  amLODipine (NORVASC) 2.5 MG tablet Take 2.5 mg by mouth daily.   Yes [provider]  aspirin 81 MG EC tablet Take 81 mg by mouth daily. Swallow whole. 09/16/19  Yes Ladell Pier, MD  atorvastatin (LIPITOR) 20 MG tablet Take 20 mg by mouth daily.   Yes [provider]  diclofenac (VOLTAREN) 75 MG EC tablet Take 75 mg by mouth 2 (two)  times daily. 07/26/19  Yes [provider]  levothyroxine (SYNTHROID, LEVOTHROID) 125 MCG tablet Take 125 mcg by mouth daily.   Yes [provider]  metoprolol (LOPRESSOR) 50 MG tablet Take 50 mg by mouth 2 (two) times daily.   Yes [provider]  mirtazapine (REMERON) 15 MG tablet Take 15 mg by mouth at bedtime.   Yes [provider]  omeprazole (PRILOSEC) 20 MG capsule Take 1 capsule (20 mg total) by mouth daily. 09/21/19  Yes Sherrill, Gary B, MD  OVER THE COUNTER MEDICATION Take 1 Scoop by mouth daily. Vital Reds Probiotic & Prebiotic   Yes [provider]  prochlorperazine (COMPAZINE) 10 MG tablet Take 1 tablet (10 mg total) by mouth every 6 (six) hours as needed. Patient taking differently: Take 10 mg by mouth every 6 (six) hours as needed for nausea or vomiting.  08/26/19  Yes Sherrill, Gary B, MD  REVLIMID 25 MG capsule TAKE 1 CAPSULE BY MOUTH DAILY FOR 14 DAYS ON, 7 DAYS OFF, REPEAT EVERY 21 DAYS. Patient taking differently: Take 25 mg by mouth  See admin instructions. Take 1 capsule by mouth daily for 14 days on, 7 days off. Repeat every 21 days. 09/29/19  Yes Sherrill, Gary B, MD  venlafaxine (EFFEXOR) 75 MG tablet Take 150 mg by mouth 2 (two) times daily.   Yes [provider]    Allergies    Patient has no known allergies.  Review of Systems   Review of Systems  Constitutional: Negative for fever.  Respiratory: Positive for cough and shortness of breath.   Cardiovascular: Negative for chest pain and leg swelling.  Gastrointestinal: Positive for diarrhea. Negative for abdominal pain and vomiting.  Neurological: Positive for dizziness.  All other systems reviewed and are negative.   Physical Exam Updated Vital Signs BP 129/68   Pulse 65   Temp 98.8 F (37.1 C) (Oral)   Resp 17   Ht 5' 6" (1.676 m)   Wt 79.8 kg   LMP  (LMP Unknown)   SpO2 96%   BMI 28.41 kg/m   Physical Exam Vitals and nursing note reviewed.  Constitutional:      General: She is not in acute distress.    Appearance: She is well-developed. She is not ill-appearing or diaphoretic.  HENT:     Head: Normocephalic and atraumatic.     Right Ear: External ear normal.     Left Ear: External ear normal.     Nose: Nose normal.  Eyes:     General:        Right eye: No discharge.        Left eye: No discharge.  Cardiovascular:     Rate and Rhythm: Normal rate and regular rhythm.     Heart sounds: Normal heart sounds.  Pulmonary:     Effort: Pulmonary effort is normal.     Breath sounds: Normal breath sounds. No decreased breath sounds, wheezing, rhonchi or rales.  Abdominal:     Palpations: Abdomen is soft.     Tenderness: There is no abdominal tenderness.  Skin:    General: Skin is warm and dry.  Neurological:     Mental Status: She is alert.  Psychiatric:        Mood and Affect: Mood is not anxious.     ED Results / Procedures / Treatments   Labs (all labs ordered are listed, but only abnormal results are displayed) Labs  Reviewed  COMPREHENSIVE METABOLIC PANEL - Abnormal; Notable   for the following components:      Result Value   Sodium 133 (*)    Glucose, Bld 104 (*)    Calcium 8.6 (*)    All other components within normal limits  CBC - Abnormal; Notable for the following components:   RBC 3.31 (*)    Hemoglobin 11.7 (*)    HCT 34.7 (*)    MCV 104.8 (*)    MCH 35.3 (*)    Platelets 148 (*)    All other components within normal limits  D-DIMER, QUANTITATIVE (NOT AT [facility]) - Abnormal; Notable for the following components:   D-Dimer, Quant 0.58 (*)    All other components within normal limits  SARS CORONAVIRUS 2 (TAT 6-24 HRS)  LIPASE, BLOOD  BRAIN NATRIURETIC PEPTIDE  URINALYSIS, ROUTINE W REFLEX MICROSCOPIC  POC SARS CORONAVIRUS 2 AG -  ED  TROPONIN I (HIGH SENSITIVITY)    EKG EKG Interpretation  Date/Time:  Thursday October 06 2019 12:36:45 EST Ventricular Rate:  73 PR Interval:    QRS Duration: 91 QT Interval:  404 QTC Calculation: 446 R Axis:   64 Text Interpretation: Sinus rhythm Probable left atrial enlargement no acute ST/T changes similar to Oct 2020 Confirmed by Goldston, Scott ([id number]) on 10/06/2019 2:13:37 PM   Radiology DG Chest 2 View  Result Date: 10/06/2019 CLINICAL DATA:  History of multiple myeloma, hypertension, shortness of breath, new chemotherapy EXAM: CHEST - 2 VIEW COMPARISON:  02/21/2012 FINDINGS: The heart size and mediastinal contours are within normal limits. Both lungs are clear. The visualized skeletal structures are unremarkable. IMPRESSION: No acute abnormality of the lungs. Electronically Signed   By: Alex  Bibbey M.D.   On: 10/06/2019 13:05    Procedures Procedures (including critical care time)  Medications Ordered in ED Medications - No data to display  ED Course  I have reviewed the triage vital signs and the nursing notes.  Pertinent labs & imaging results that were available during my care of the patient were reviewed by me and considered in my  medical decision making (see chart for details).    MDM Rules/Calculators/A&P                      Age adjusted Ddimer is negative.  Vital signs are stable.  No chest pain.  Unclear exact cause of her "swimmy feeling".  No chest pain.  The cough and shortness of breath sounds like an infectious source but there is no obvious bacterial pneumonia on chest x-ray.  Initial Covid antigen testing is negative, will send confirmatory test.  Given her symptoms have all been ongoing for multiple days, I think single troponin and negative ECG for ischemia would make this pretty unlikely to be ACS.  Doubt PE with the negative age-adjusted D-dimer and no hypoxia.  Appears stable for discharge home.  Katrina Cunningham was evaluated in Emergency Department on 10/06/2019 for the symptoms described in the history of present illness. She was evaluated in the context of the global COVID-19 pandemic, which necessitated consideration that the patient might be at risk for infection with the SARS-CoV-2 virus that causes COVID-19. Institutional protocols and algorithms that pertain to the evaluation of patients at risk for COVID-19 are in a state of rapid change based on information released by regulatory bodies including the CDC and federal and state organizations. These policies and algorithms were followed during the patient's care in the ED.  Final Clinical Impression(s) / ED Diagnoses Final diagnoses:  Viral illness      Rx / DC Orders ED Discharge Orders    None       Sherwood Gambler, MD 10/06/19 [phone]

## 2019-10-07 ENCOUNTER — Telehealth: Payer: Self-pay | Admitting: *Deleted

## 2019-10-07 ENCOUNTER — Other Ambulatory Visit: Payer: Self-pay | Admitting: Nurse Practitioner

## 2019-10-07 ENCOUNTER — Other Ambulatory Visit: Payer: BC Managed Care – PPO

## 2019-10-07 DIAGNOSIS — U071 COVID-19: Secondary | ICD-10-CM

## 2019-10-07 DIAGNOSIS — C9000 Multiple myeloma not having achieved remission: Secondary | ICD-10-CM

## 2019-10-07 NOTE — Telephone Encounter (Signed)
Left VM that she tested positive for COVID on 10/06/2019. Will receive the bamlanimivab infusion on 12/21.  Per Dr. Benay Spice: Hold Revlimid. Cancel appointments next week. Can return to office 21 days from 12/17 for treatment, unless repeat COVID test in 14 days is negative. Patient notified. She understands and agrees. Will schedule f/u on 10/27/2019 to resume treatment. She is under quarantine with her daughter, Caryl Pina. They have people bringing groceries if needed.

## 2019-10-07 NOTE — Progress Notes (Signed)
  I connected by phone with Katrina Cunningham on 79/48/0165 at 9:21 AM to discuss the potential use of an new treatment for mild to moderate COVID-19 viral infection in non-hospitalized patients.  This patient is a 63 y.o. female that meets the FDA criteria for Emergency Use Authorization of bamlanivimab or casirivimab\imdevimab.  Has a (+) direct SARS-CoV-2 viral test result  Has mild or moderate COVID-19   Is ? 63 years of age and weighs ? 40 kg  Is NOT hospitalized due to COVID-19  Is NOT requiring oxygen therapy or requiring an increase in baseline oxygen flow rate due to COVID-19  Is within 10 days of symptom onset  Has at least one of the high risk factor(s) for progression to severe COVID-19 and/or hospitalization as defined in EUA.  Specific high risk criteria : Immunosuppressive Disease  Patient Active Problem List   Diagnosis Date Noted  . Multiple myeloma ([place]) 08/23/2019  . Goals of care, counseling/discussion 08/23/2019  . Vitamin D deficiency 04/15/2019  . Current moderate episode of major depressive disorder without prior episode ([place]) 04/13/2019  . Hypertension 02/24/2018  . Hyperlipidemia 02/24/2018  . Hypothyroidism 09/16/2017    I have spoken and communicated the following to the patient or parent/caregiver:  1. FDA has authorized the emergency use of bamlanivimab and casirivimab\imdevimab for the treatment of mild to moderate COVID-19 in adults and pediatric patients with positive results of direct SARS-CoV-2 viral testing who are 27 years of age and older weighing at least 40 kg, and who are at high risk for progressing to severe COVID-19 and/or hospitalization.  2. The significant known and potential risks and benefits of bamlanivimab and casirivimab\imdevimab, and the extent to which such potential risks and benefits are unknown.  3. Information on available alternative treatments and the risks and benefits of those alternatives, including clinical  trials.  4. Patients treated with bamlanivimab and casirivimab\imdevimab should continue to self-isolate and use infection control measures (e.g., wear mask, isolate, social distance, avoid sharing personal items, clean and disinfect "high touch" surfaces, and frequent handwashing) according to CDC guidelines.   5. The patient or parent/caregiver has the option to accept or refuse bamlanivimab or casirivimab\imdevimab .  After reviewing this information with the patient, The patient agreed to proceed with receiving the bamlanimivab infusion and will be provided a copy of the Fact sheet prior to receiving the infusion.Fenton Foy 10/07/2019 9:21 AM

## 2019-10-10 ENCOUNTER — Encounter (HOSPITAL_COMMUNITY): Payer: Self-pay

## 2019-10-10 ENCOUNTER — Telehealth: Payer: Self-pay | Admitting: Oncology

## 2019-10-10 ENCOUNTER — Ambulatory Visit (HOSPITAL_COMMUNITY)
Admission: RE | Admit: 2019-10-10 | Discharge: 2019-10-10 | Disposition: A | Discharge status: Home / Self Care | Payer: BC Managed Care – PPO | Source: Ambulatory Visit | Attending: Pulmonary Disease | Admitting: Pulmonary Disease

## 2019-10-10 DIAGNOSIS — U071 COVID-19: Secondary | ICD-10-CM | POA: Diagnosis present

## 2019-10-10 DIAGNOSIS — C9000 Multiple myeloma not having achieved remission: Secondary | ICD-10-CM | POA: Insufficient documentation

## 2019-10-10 MED ORDER — METHYLPREDNISOLONE SODIUM SUCC 125 MG IJ SOLR: 125.0000 mg | Freq: Once | INTRAMUSCULAR | Status: DC | PRN

## 2019-10-10 MED ORDER — DIPHENHYDRAMINE HCL 50 MG/ML IJ SOLN: 50.0000 mg | Freq: Once | INTRAMUSCULAR | Status: DC | PRN

## 2019-10-10 MED ORDER — FAMOTIDINE IN NACL 20-0.9 MG/50ML-% IV SOLN: 20.0000 mg | Freq: Once | INTRAVENOUS | Status: DC | PRN

## 2019-10-10 MED ORDER — SODIUM CHLORIDE 0.9 % IV SOLN
700.0000 mg | Freq: Once | INTRAVENOUS | Status: AC
  Administered 2019-10-10: 15:00:00 700 mg via INTRAVENOUS
  Filled 2019-10-10: qty 20

## 2019-10-10 MED ORDER — EPINEPHRINE 0.3 MG/0.3ML IJ SOAJ: 0.3000 mg | Freq: Once | INTRAMUSCULAR | Status: DC | PRN

## 2019-10-10 MED ORDER — ALBUTEROL SULFATE HFA 108 (90 BASE) MCG/ACT IN AERS: 2.0000 | INHALATION_SPRAY | Freq: Once | RESPIRATORY_TRACT | Status: DC | PRN

## 2019-10-10 MED ORDER — SODIUM CHLORIDE 0.9 % IV SOLN
INTRAVENOUS | Status: DC | PRN
  Administered 2019-10-10: 15:00:00 250 mL via INTRAVENOUS

## 2019-10-10 NOTE — Telephone Encounter (Signed)
Scheduled appt per 12/18 sch message - unable to reach pt . Left message with appt date and time   

## 2019-10-10 NOTE — Discharge Instructions (Signed)
COVID-19 COVID-19 is a respiratory infection that is caused by a virus called severe acute respiratory syndrome coronavirus 2 (SARS-CoV-2). The disease is also known as coronavirus disease or novel coronavirus. In some people, the virus may not cause any symptoms. In others, it may cause a serious infection. The infection can get worse quickly and can lead to complications, such as:  Pneumonia, or infection of the lungs.  Acute respiratory distress syndrome or ARDS. This is fluid build-up in the lungs.  Acute respiratory failure. This is a condition in which there is not enough oxygen passing from the lungs to the body.  Sepsis or septic shock. This is a serious bodily reaction to an infection.  Blood clotting problems.  Secondary infections due to bacteria or fungus. The virus that causes COVID-19 is contagious. This means that it can spread from person to person through droplets from coughs and sneezes (respiratory secretions). What are the causes? This illness is caused by a virus. You may catch the virus by:  Breathing in droplets from an infected person's cough or sneeze.  Touching something, like a table or a doorknob, that was exposed to the virus (contaminated) and then touching your mouth, nose, or eyes. What increases the risk? Risk for infection You are more likely to be infected with this virus if you:  Live in or travel to an area with a COVID-19 outbreak.  Come in contact with a sick person who recently traveled to an area with a COVID-19 outbreak.  Provide care for or live with a person who is infected with COVID-19. Risk for serious illness You are more likely to become seriously ill from the virus if you:  Are 65 years of age or older.  Have a long-term disease that lowers your body's ability to fight infection (immunocompromised).  Live in a nursing home or long-term care facility.  Have a long-term (chronic) disease such as: ? Chronic lung disease, including  chronic obstructive pulmonary disease or asthma ? Heart disease. ? Diabetes. ? Chronic kidney disease. ? Liver disease.  Are obese. What are the signs or symptoms? Symptoms of this condition can range from mild to severe. Symptoms may appear any time from 2 to 14 days after being exposed to the virus. They include:  A fever.  A cough.  Difficulty breathing.  Chills.  Muscle pains.  A sore throat.  Loss of taste or smell. Some people may also have stomach problems, such as nausea, vomiting, or diarrhea. Other people may not have any symptoms of COVID-19. How is this diagnosed? This condition may be diagnosed based on:  Your signs and symptoms, especially if: ? You live in an area with a COVID-19 outbreak. ? You recently traveled to or from an area where the virus is common. ? You provide care for or live with a person who was diagnosed with COVID-19.  A physical exam.  Lab tests, which may include: ? A nasal swab to take a sample of fluid from your nose. ? A throat swab to take a sample of fluid from your throat. ? A sample of mucus from your lungs (sputum). ? Blood tests.  Imaging tests, which may include, X-rays, CT scan, or ultrasound. How is this treated? At present, there is no medicine to treat COVID-19. Medicines that treat other diseases are being used on a trial basis to see if they are effective against COVID-19. Your health care provider will talk with you about ways to treat your symptoms. For most   people, the infection is mild and can be managed at home with rest, fluids, and over-the-counter medicines. Treatment for a serious infection usually takes places in a hospital intensive care unit (ICU). It may include one or more of the following treatments. These treatments are given until your symptoms improve.  Receiving fluids and medicines through an IV.  Supplemental oxygen. Extra oxygen is given through a tube in the nose, a face mask, or a  hood.  Positioning you to lie on your stomach (prone position). This makes it easier for oxygen to get into the lungs.  Continuous positive airway pressure (CPAP) or bi-level positive airway pressure (BPAP) machine. This treatment uses mild air pressure to keep the airways open. A tube that is connected to a motor delivers oxygen to the body.  Ventilator. This treatment moves air into and out of the lungs by using a tube that is placed in your windpipe.  Tracheostomy. This is a procedure to create a hole in the neck so that a breathing tube can be inserted.  Extracorporeal membrane oxygenation (ECMO). This procedure gives the lungs a chance to recover by taking over the functions of the heart and lungs. It supplies oxygen to the body and removes carbon dioxide. Follow these instructions at home: Lifestyle  If you are sick, stay home except to get medical care. Your health care provider will tell you how long to stay home. Call your health care provider before you go for medical care.  Rest at home as told by your health care provider.  Do not use any products that contain nicotine or tobacco, such as cigarettes, e-cigarettes, and chewing tobacco. If you need help quitting, ask your health care provider.  Return to your normal activities as told by your health care provider. Ask your health care provider what activities are safe for you. General instructions  Take over-the-counter and prescription medicines only as told by your health care provider.  Drink enough fluid to keep your urine pale yellow.  Keep all follow-up visits as told by your health care provider. This is important. How is this prevented?  There is no vaccine to help prevent COVID-19 infection. However, there are steps you can take to protect yourself and others from this virus. To protect yourself:   Do not travel to areas where COVID-19 is a risk. The areas where COVID-19 is reported change often. To identify  high-risk areas and travel restrictions, check the CDC travel website: wwwnc.cdc.gov/travel/notices  If you live in, or must travel to, an area where COVID-19 is a risk, take precautions to avoid infection. ? Stay away from people who are sick. ? Wash your hands often with soap and water for 20 seconds. If soap and water are not available, use an alcohol-based hand sanitizer. ? Avoid touching your mouth, face, eyes, or nose. ? Avoid going out in public, follow guidance from your state and local health authorities. ? If you must go out in public, wear a cloth face covering or face mask. ? Disinfect objects and surfaces that are frequently touched every day. This may include:  Counters and tables.  Doorknobs and light switches.  Sinks and faucets.  Electronics, such as phones, remote controls, keyboards, computers, and tablets. To protect others: If you have symptoms of COVID-19, take steps to prevent the virus from spreading to others.  If you think you have a COVID-19 infection, contact your health care provider right away. Tell your health care team that you think you may   have a COVID-19 infection.  Stay home. Leave your house only to seek medical care. Do not use public transport.  Do not travel while you are sick.  Wash your hands often with soap and water for 20 seconds. If soap and water are not available, use alcohol-based hand sanitizer.  Stay away from other members of your household. Let healthy household members care for children and pets, if possible. If you have to care for children or pets, wash your hands often and wear a mask. If possible, stay in your own room, separate from others. Use a different bathroom.  Make sure that all people in your household wash their hands well and often.  Cough or sneeze into a tissue or your sleeve or elbow. Do not cough or sneeze into your hand or into the air.  Wear a cloth face covering or face mask. Where to find more  information  Centers for Disease Control and Prevention: PurpleGadgets.be  World Health Organization: https://www.castaneda.info/ Contact a health care provider if:  You live in or have traveled to an area where COVID-19 is a risk and you have symptoms of the infection.  You have had contact with someone who has COVID-19 and you have symptoms of the infection. Get help right away if:  You have trouble breathing.  You have pain or pressure in your chest.  You have confusion.  You have bluish lips and fingernails.  You have difficulty waking from sleep.  You have symptoms that get worse. These symptoms may represent a serious problem that is an emergency. Do not wait to see if the symptoms will go away. Get medical help right away. Call your local emergency services (911 in the [place] Do not drive yourself to the hospital. Let the emergency medical personnel know if you think you have COVID-19. Summary  COVID-19 is a respiratory infection that is caused by a virus. It is also known as coronavirus disease or novel coronavirus. It can cause serious infections, such as pneumonia, acute respiratory distress syndrome, acute respiratory failure, or sepsis.  The virus that causes COVID-19 is contagious. This means that it can spread from person to person through droplets from coughs and sneezes.  You are more likely to develop a serious illness if you are 4 years of age or older, have a weak immunity, live in a nursing home, or have chronic disease.  There is no medicine to treat COVID-19. Your health care provider will talk with you about ways to treat your symptoms.  Take steps to protect yourself and others from infection. Wash your hands often and disinfect objects and surfaces that are frequently touched every day. Stay away from people who are sick and wear a mask if you are sick. This information is not intended to replace advice given to you by  your health care provider. Make sure you discuss any questions you have with your health care provider. Document Released: 11/11/2018 Document Revised: 03/03/2019 Document Reviewed: 11/11/2018 Elsevier Patient Education  [place] if You Are Sick If you are sick with COVID-19 or think you might have COVID-19, follow the steps below to help protect other people in your home and community. Stay home except to get medical care.  Stay home. Most people with COVID-19 have mild illness and are able to recover at home without medical care. Do not leave your home, except to get medical care. Do not visit public areas.  Take care of yourself.  Get rest and stay hydrated.  Get medical care when needed. Call your doctor before you go to their office for care. But, if you have trouble breathing or other concerning symptoms, call 911 for immediate help.  Avoid public transportation, ride-sharing, or taxis. Separate yourself from other people and pets in your home.  As much as possible, stay in a specific room and away from other people and pets in your home. Also, you should use a separate bathroom, if available. If you need to be around other people or animals in or outside of the home, wear a cloth face covering. ? See COVID-19 and Animals if you have questions about pets: https://www.thomas.biz/ Monitor your symptoms.  Common symptoms of COVID-19 include fever and cough. Trouble breathing is a more serious symptom that means you should get medical attention.  Follow care instructions from your healthcare provider and local health department. Your local health authorities will give instructions on checking your symptoms and reporting information. If you develop emergency warning signs for COVID-19 get medical attention immediately.  Emergency warning signs include*:  Trouble breathing  Persistent pain or pressure in  the chest  New confusion or not able to be woken  Bluish lips or face *This list is not all inclusive. Please consult your medical provider for any other symptoms that are severe or concerning to you. Call 911 if you have a medical emergency. If you have a medical emergency and need to call 911, notify the operator that you have or think you might have, COVID-19. If possible, put on a facemask before medical help arrives. Call ahead before visiting your doctor.  Call ahead. Many medical visits for routine care are being postponed or done by phone or telemedicine.  If you have a medical appointment that cannot be postponed, call your doctor's office. This will help the office protect themselves and other patients. If you are sick, wear a cloth covering over your nose and mouth.  You should wear a cloth face covering over your nose and mouth if you must be around other people or animals, including pets (even at home).  You don't need to wear the cloth face covering if you are alone. If you can't put on a cloth face covering (because of trouble breathing for example), cover your coughs and sneezes in some other way. Try to stay at least 6 feet away from other people. This will help protect the people around you. Note: During the COVID-19 pandemic, medical grade facemasks are reserved for healthcare workers and some first responders. You may need to make a cloth face covering using a scarf or bandana. Cover your coughs and sneezes.  Cover your mouth and nose with a tissue when you cough or sneeze.  Throw used tissues in a lined trash can.  Immediately wash your hands with soap and water for at least 20 seconds. If soap and water are not available, clean your hands with an alcohol-based hand sanitizer that contains at least 60% alcohol. Clean your hands often.  Wash your hands often with soap and water for at least 20 seconds. This is especially important after blowing your nose, coughing, or  sneezing; going to the bathroom; and before eating or preparing food.  Use hand sanitizer if soap and water are not available. Use an alcohol-based hand sanitizer with at least 60% alcohol, covering all surfaces of your hands and rubbing them together until they feel dry.  Soap and water are the best option, especially if your  hands are visibly dirty.  Avoid touching your eyes, nose, and mouth with unwashed hands. Avoid sharing personal household items.  Do not share dishes, drinking glasses, cups, eating utensils, towels, or bedding with other people in your home.  Wash these items thoroughly after using them with soap and water or put them in the dishwasher. Clean all "high-touch" surfaces everyday.  Clean and disinfect high-touch surfaces in your "sick room" and bathroom. Let someone else clean and disinfect surfaces in common areas, but not your bedroom and bathroom.  If a caregiver or other person needs to clean and disinfect a sick person's bedroom or bathroom, they should do so on an as-needed basis. The caregiver/other person should wear a mask and wait as long as possible after the sick person has used the bathroom. High-touch surfaces include phones, remote controls, counters, tabletops, doorknobs, bathroom fixtures, toilets, keyboards, tablets, and bedside tables.  Clean and disinfect areas that may have blood, stool, or body fluids on them.  Use household cleaners and disinfectants. Clean the area or item with soap and water or another detergent if it is dirty. Then use a household disinfectant. ? Be sure to follow the instructions on the label to ensure safe and effective use of the product. Many products recommend keeping the surface wet for several minutes to ensure germs are killed. Many also recommend precautions such as wearing gloves and making sure you have good ventilation during use of the product. ? Most EPA-registered household disinfectants should be effective. How to  discontinue home isolation  People with COVID-19 who have stayed home (home isolated) can stop home isolation under the following conditions: ? If you will not have a test to determine if you are still contagious, you can leave home after these three things have happened:  You have had no fever for at least 72 hours (that is three full days of no fever without the use of medicine that reduces fevers) AND  other symptoms have improved (for example, when your cough or shortness of breath has improved) AND  at least 10 days have passed since your symptoms first appeared. ? If you will be tested to determine if you are still contagious, you can leave home after these three things have happened:  You no longer have a fever (without the use of medicine that reduces fevers) AND  other symptoms have improved (for example, when your cough or shortness of breath has improved) AND  you received two negative tests in a row, 24 hours apart. Your doctor will follow CDC guidelines. In all cases, follow the guidance of your healthcare provider and local health department. The decision to stop home isolation should be made in consultation with your healthcare provider and state and local health departments. Local decisions depend on local circumstances. [email or web address] 02/20/2019 This information is not intended to replace advice given to you by your health care provider. Make sure you discuss any questions you have with your health care provider. Document Released: 02/01/2019 Document Revised: 03/02/2019 Document Reviewed: 02/01/2019 Elsevier Patient Education  [place].

## 2019-10-10 NOTE — Progress Notes (Signed)
  Diagnosis: COVID-19  Physician: Dr. Jacqulynn Cadet  Procedure: [facility] Med: bamlanivimab infusion - Provided patient with bamlanimivab fact sheet for patients, parents and caregivers prior to infusion.  Complications: No immediate complications noted.  Discharge: Discharged home   Nely Dedmon N Benny Deutschman 10/10/2019

## 2019-10-11 ENCOUNTER — Encounter: Payer: Self-pay | Admitting: Nurse Practitioner

## 2019-10-11 ENCOUNTER — Telehealth: Payer: Self-pay

## 2019-10-11 NOTE — Telephone Encounter (Signed)
Left voicemail in regard to patients non urgent question stating that she received a confirmation text from [facility] to confirm an appointment with them on January 11th and she wanted to know if she should keep the appointment or cancel it. I let her know that Ned Card NP suggest that she make [facility] aware of her current situation and they can advise her of their policy.

## 2019-10-12 NOTE — Telephone Encounter (Signed)
Called patient and informed her that Dr. Benay Spice thinks she should still keep the appointment at [facility] on 10/31/19. She also informed RN that she is slowly feeling better.

## 2019-10-13 ENCOUNTER — Other Ambulatory Visit: Payer: BC Managed Care – PPO

## 2019-10-13 ENCOUNTER — Ambulatory Visit: Payer: BC Managed Care – PPO | Admitting: Oncology

## 2019-10-13 ENCOUNTER — Other Ambulatory Visit: Payer: Self-pay | Admitting: Oncology

## 2019-10-13 ENCOUNTER — Ambulatory Visit: Payer: BC Managed Care – PPO

## 2019-10-13 DIAGNOSIS — C9000 Multiple myeloma not having achieved remission: Secondary | ICD-10-CM

## 2019-10-18 ENCOUNTER — Encounter: Payer: Self-pay | Admitting: Oncology

## 2019-10-19 ENCOUNTER — Other Ambulatory Visit: Payer: BC Managed Care – PPO

## 2019-10-20 ENCOUNTER — Ambulatory Visit: Payer: BC Managed Care – PPO | Attending: Internal Medicine

## 2019-10-20 DIAGNOSIS — Z20822 Contact with and (suspected) exposure to covid-19: Secondary | ICD-10-CM

## 2019-10-21 ENCOUNTER — Other Ambulatory Visit: Payer: Self-pay | Admitting: Oncology

## 2019-10-22 LAB — NOVEL CORONAVIRUS, NAA: SARS-CoV-2, NAA: NOT DETECTED

## 2019-10-26 ENCOUNTER — Encounter: Payer: Self-pay | Admitting: Oncology

## 2019-10-27 ENCOUNTER — Encounter: Payer: Self-pay | Admitting: Nurse Practitioner

## 2019-10-27 ENCOUNTER — Inpatient Hospital Stay: Payer: BC Managed Care – PPO | Attending: Oncology

## 2019-10-27 ENCOUNTER — Inpatient Hospital Stay (HOSPITAL_BASED_OUTPATIENT_CLINIC_OR_DEPARTMENT_OTHER): Payer: BC Managed Care – PPO | Admitting: Nurse Practitioner

## 2019-10-27 ENCOUNTER — Other Ambulatory Visit: Payer: Self-pay

## 2019-10-27 ENCOUNTER — Other Ambulatory Visit: Payer: Self-pay | Admitting: *Deleted

## 2019-10-27 ENCOUNTER — Inpatient Hospital Stay: Payer: BC Managed Care – PPO

## 2019-10-27 VITALS — BP 143/71 | HR 72 | Temp 97.3°F | Resp 17 | Ht 66.0 in | Wt 178.8 lb

## 2019-10-27 DIAGNOSIS — C9000 Multiple myeloma not having achieved remission: Secondary | ICD-10-CM

## 2019-10-27 DIAGNOSIS — Z5112 Encounter for antineoplastic immunotherapy: Secondary | ICD-10-CM | POA: Insufficient documentation

## 2019-10-27 DIAGNOSIS — F419 Anxiety disorder, unspecified: Secondary | ICD-10-CM | POA: Diagnosis not present

## 2019-10-27 DIAGNOSIS — D630 Anemia in neoplastic disease: Secondary | ICD-10-CM | POA: Insufficient documentation

## 2019-10-27 LAB — CMP (CANCER CENTER ONLY)
ALT: 31 U/L (ref 0–44)
AST: 23 U/L (ref 15–41)
Albumin: 4.1 g/dL (ref 3.5–5.0)
Alkaline Phosphatase: 101 U/L (ref 38–126)
Anion gap: 9 (ref 5–15)
BUN: 14 mg/dL (ref 8–23)
CO2: 24 mmol/L (ref 22–32)
Calcium: 8.8 mg/dL — ABNORMAL LOW (ref 8.9–10.3)
Chloride: 105 mmol/L (ref 98–111)
Creatinine: 0.73 mg/dL (ref 0.44–1.00)
GFR, Est AFR Am: >60 mL/min (ref >60)
GFR, Estimated: >60 mL/min (ref >60)
Glucose, Bld: 99 mg/dL (ref 70–99)
Potassium: 4.2 mmol/L (ref 3.5–5.1)
Sodium: 138 mmol/L (ref 135–145)
Total Bilirubin: 0.8 mg/dL (ref 0.3–1.2)
Total Protein: 7.4 g/dL (ref 6.5–8.1)

## 2019-10-27 LAB — CBC WITH DIFFERENTIAL (CANCER CENTER ONLY)
Abs Immature Granulocytes: 0.02 10*3/uL (ref 0.00–0.07)
Basophils Absolute: 0.1 10*3/uL (ref 0.0–0.1)
Basophils Relative: 2 %
Eosinophils Absolute: 0.1 10*3/uL (ref 0.0–0.5)
Eosinophils Relative: 1 %
HCT: 36.3 % (ref 36.0–46.0)
Hemoglobin: 12.1 g/dL (ref 12.0–15.0)
Immature Granulocytes: 0 %
Lymphocytes Relative: 61 %
Lymphs Abs: 3 10*3/uL (ref 0.7–4.0)
MCH: 35.2 pg — ABNORMAL HIGH (ref 26.0–34.0)
MCHC: 33.3 g/dL (ref 30.0–36.0)
MCV: 105.5 fL — ABNORMAL HIGH (ref 80.0–100.0)
Monocytes Absolute: 0.5 10*3/uL (ref 0.1–1.0)
Monocytes Relative: 9 %
Neutro Abs: 1.3 10*3/uL — ABNORMAL LOW (ref 1.7–7.7)
Neutrophils Relative %: 27 %
Platelet Count: 257 10*3/uL (ref 150–400)
RBC: 3.44 MIL/uL — ABNORMAL LOW (ref 3.87–5.11)
RDW: 13.1 % (ref 11.5–15.5)
WBC Count: 5 10*3/uL (ref 4.0–10.5)
nRBC: 0 % (ref 0.0–0.2)

## 2019-10-27 MED ORDER — DEXAMETHASONE 4 MG PO TABS
40.0000 mg | ORAL_TABLET | Freq: Once | ORAL | Status: AC
  Administered 2019-10-27: 40 mg via ORAL

## 2019-10-27 MED ORDER — PROCHLORPERAZINE MALEATE 10 MG PO TABS
ORAL_TABLET | ORAL | Status: AC
  Filled 2019-10-27: qty 1

## 2019-10-27 MED ORDER — BORTEZOMIB CHEMO SQ INJECTION 3.5 MG (2.5MG/ML)
1.3000 mg/m2 | Freq: Once | INTRAMUSCULAR | Status: AC
  Administered 2019-10-27: 2.5 mg via SUBCUTANEOUS
  Filled 2019-10-27: qty 1

## 2019-10-27 MED ORDER — PROCHLORPERAZINE MALEATE 10 MG PO TABS
10.0000 mg | ORAL_TABLET | Freq: Once | ORAL | Status: AC
  Administered 2019-10-27: 16:00:00 10 mg via ORAL

## 2019-10-27 MED ORDER — DEXAMETHASONE 4 MG PO TABS
ORAL_TABLET | ORAL | Status: AC
  Filled 2019-10-27: qty 10

## 2019-10-27 MED ORDER — LENALIDOMIDE 25 MG PO CAPS: ORAL_CAPSULE | ORAL | 0 refills | Status: DC

## 2019-10-27 NOTE — Progress Notes (Addendum)
  [facility] OFFICE PROGRESS NOTE   Diagnosis: Multiple myeloma  INTERVAL HISTORY:   Katrina Cunningham returns for follow-up.  She began cycle 2 Revlimid/Velcade on 09/29/2019.  She had a positive Covid test 10/06/2019.  Treatment was placed on hold.  She is now at day 21 since a positive Covid test and is seen for follow-up.  She is feeling better.  Sense of taste and smell have improved.  No nausea or vomiting.  Dyspnea is better.  She has a persistent cough.  No fever.  Objective:  Vital signs in last 24 hours:  Blood pressure (!) 143/71, pulse 72, temperature (!) 97.3 F (36.3 C), temperature source Temporal, resp. rate 17, height '5\' 6"'$  (1.676 m), weight 178 lb 12.8 oz (81.1 kg), SpO2 100 %.     Resp: Lungs clear bilaterally. GI: Abdomen soft and nontender. Vascular: No leg edema. Neuro: Alert and oriented. Skin: No rash.   Lab Results:  Lab Results  Component Value Date   WBC 5.0 10/27/2019   HGB 12.1 10/27/2019   HCT 36.3 10/27/2019   MCV 105.5 (H) 10/27/2019   PLT 257 10/27/2019   NEUTROABS 1.3 (L) 10/27/2019    Imaging:  No results found.  Medications: I have reviewed the patient's current medications.  Assessment/Plan: 1. Multiple myeloma-IgG kappa ? 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 MRI lumbar spine 07/28/2019-acute L1 compression fracture without central canal stenosis, enhancing lesions at L3 and L5 ? Bone survey 08/23/2019-prominent lucency over the temporal region on the lateral skull view. No other focal abnormality identified. ? 08/23/2019 serum kappa light chains 45, serum M spike 1.6, IgG 2154, serum immunofixation-IgG kappa ? Zometa every 3 months beginning 08/26/2019  ? skull x-ray 08/29/2019-temporal calvarial lucency unchanged since a head CT May 2013 felt to be a normal variant. ? 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  Cytogenetics-40 6XX ? Cycle 1 RVD 09/02/2019 (anticipate she will begin Revlimid 09/07/2019) ? Cycle 2 RVD 09/29/2019 (prematurely discontinued 10/06/2019 due to a diagnosis of Covid) ? Cycle 3 RVD 10/27/2019 2. Acute L1 pathological pressure fracture, status post a kyphoplasty 08/10/2019  3.Pain secondary #2  4.Tobacco and alcohol use  5.History of multiple back surgeries  6.Anemia secondary to #1  7.Anxiety/depression  8.  Covid 10/05/2020   Disposition: Katrina Cunningham appears stable.  She has completed 1 full cycle of RVD and part of a second cycle.  Treatment was interrupted during cycle 2 due to Covid diagnosis.  Plan to resume treatment today with cycle 3, day 1.  We reviewed the CBC from today.  Counts adequate to proceed with treatment.  She has mild neutropenia.  She understands to contact the office with fever, chills, other signs of infection.  She will return for lab and Velcade in 1 week.  She will return for lab, follow-up and Velcade in 2 weeks.  She will contact the office in the interim as outlined above or with any other problems.  Patient seen with Dr. Benay Spice.    Katrina Cunningham ANP/GNP-BC   10/27/2019  2:46 PM  This was a shared visit with Katrina Cunningham.  Katrina Cunningham has recovered from the Covid infection.  She will resume treatment with cycle 3 today.  Her insurance will be changing and she will plan to transition her care to [facility] in approximately 1 month.  Katrina Manson, Katrina Cunningham

## 2019-10-27 NOTE — Progress Notes (Signed)
Per Ned Card NP, OK to treat with ANC 1.3

## 2019-10-27 NOTE — Patient Instructions (Signed)
[facility] Discharge Instructions for Patients Receiving Chemotherapy  Today you received the following chemotherapy agents Velcade.  To help prevent nausea and vomiting after your treatment, we encourage you to take your nausea medication as directed.  If you develop nausea and vomiting that is not controlled by your nausea medication, call the clinic.   BELOW ARE SYMPTOMS THAT SHOULD BE REPORTED IMMEDIATELY:  *FEVER GREATER THAN 100.5 F  *CHILLS WITH OR WITHOUT FEVER  NAUSEA AND VOMITING THAT IS NOT CONTROLLED WITH YOUR NAUSEA MEDICATION  *UNUSUAL SHORTNESS OF BREATH  *UNUSUAL BRUISING OR BLEEDING  TENDERNESS IN MOUTH AND THROAT WITH OR WITHOUT PRESENCE OF ULCERS  *URINARY PROBLEMS  *BOWEL PROBLEMS  UNUSUAL RASH Items with * indicate a potential emergency and should be followed up as soon as possible.  Feel free to call the clinic should you have any questions or concerns. The clinic phone number is [phone].  Please show the CHEMO ALERT CARD at check-in to the Emergency Department and triage nurse.   

## 2019-10-28 LAB — PROTEIN ELECTROPHORESIS, SERUM
A/G Ratio: 1.1 (ref 0.7–1.7)
Albumin ELP: 3.8 g/dL (ref 2.9–4.4)
Alpha-1-Globulin: 0.2 g/dL (ref 0.0–0.4)
Alpha-2-Globulin: 0.8 g/dL (ref 0.4–1.0)
Beta Globulin: 1.1 g/dL (ref 0.7–1.3)
Gamma Globulin: 1.4 g/dL (ref 0.4–1.8)
Globulin, Total: 3.4 g/dL (ref 2.2–3.9)
M-Spike, %: 0.9 g/dL — ABNORMAL HIGH
Total Protein ELP: 7.2 g/dL (ref 6.0–8.5)

## 2019-10-28 LAB — KAPPA/LAMBDA LIGHT CHAINS
Kappa free light chain: 31.3 mg/L — ABNORMAL HIGH (ref 3.3–19.4)
Kappa, lambda light chain ratio: 2.59 — ABNORMAL HIGH (ref 0.26–1.65)
Lambda free light chains: 12.1 mg/L (ref 5.7–26.3)

## 2019-10-28 LAB — IGG: IgG (Immunoglobin G), Serum: 1427 mg/dL (ref 586–1602)

## 2019-11-01 ENCOUNTER — Encounter: Payer: Self-pay | Admitting: Nurse Practitioner

## 2019-11-01 ENCOUNTER — Telehealth: Payer: Self-pay | Admitting: Oncology

## 2019-11-01 NOTE — Telephone Encounter (Signed)
Returned patient's phone call regarding rescheduling 01/15 appointment time, per patient's request appointment time has been rescheduled.

## 2019-11-02 ENCOUNTER — Telehealth: Payer: Self-pay | Admitting: *Deleted

## 2019-11-02 NOTE — Telephone Encounter (Addendum)
Called to inquire what is needed from this office to transition care to Dr. Norma Fredrickson at [facility] due to new insurance carrier. Provided her with dates of this last cycle and when last Zometa was given. Requesting our office to do one more refill on her Revlimid and they will pick it up on February. Will refill on 11/14/19.

## 2019-11-04 ENCOUNTER — Other Ambulatory Visit: Payer: Self-pay

## 2019-11-04 ENCOUNTER — Other Ambulatory Visit: Payer: BC Managed Care – PPO

## 2019-11-04 ENCOUNTER — Ambulatory Visit: Payer: BC Managed Care – PPO

## 2019-11-04 ENCOUNTER — Inpatient Hospital Stay: Payer: BC Managed Care – PPO

## 2019-11-04 VITALS — BP 136/76 | HR 64 | Temp 98.0°F | Resp 16

## 2019-11-04 DIAGNOSIS — Z5112 Encounter for antineoplastic immunotherapy: Secondary | ICD-10-CM | POA: Diagnosis not present

## 2019-11-04 DIAGNOSIS — C9000 Multiple myeloma not having achieved remission: Secondary | ICD-10-CM

## 2019-11-04 LAB — CBC WITH DIFFERENTIAL (CANCER CENTER ONLY)
Abs Immature Granulocytes: 0.08 10*3/uL — ABNORMAL HIGH (ref 0.00–0.07)
Basophils Absolute: 0 10*3/uL (ref 0.0–0.1)
Basophils Relative: 1 %
Eosinophils Absolute: 0.2 10*3/uL (ref 0.0–0.5)
Eosinophils Relative: 3 %
HCT: 38.9 % (ref 36.0–46.0)
Hemoglobin: 13 g/dL (ref 12.0–15.0)
Immature Granulocytes: 1 %
Lymphocytes Relative: 39 %
Lymphs Abs: 2.4 10*3/uL (ref 0.7–4.0)
MCH: 35.1 pg — ABNORMAL HIGH (ref 26.0–34.0)
MCHC: 33.4 g/dL (ref 30.0–36.0)
MCV: 105.1 fL — ABNORMAL HIGH (ref 80.0–100.0)
Monocytes Absolute: 0.6 10*3/uL (ref 0.1–1.0)
Monocytes Relative: 9 %
Neutro Abs: 2.9 10*3/uL (ref 1.7–7.7)
Neutrophils Relative %: 47 %
Platelet Count: 174 10*3/uL (ref 150–400)
RBC: 3.7 MIL/uL — ABNORMAL LOW (ref 3.87–5.11)
RDW: 12.9 % (ref 11.5–15.5)
WBC Count: 6.2 10*3/uL (ref 4.0–10.5)
nRBC: 0 % (ref 0.0–0.2)

## 2019-11-04 LAB — CMP (CANCER CENTER ONLY)
ALT: 41 U/L (ref 0–44)
AST: 23 U/L (ref 15–41)
Albumin: 4.1 g/dL (ref 3.5–5.0)
Alkaline Phosphatase: 86 U/L (ref 38–126)
Anion gap: 10 (ref 5–15)
BUN: 14 mg/dL (ref 8–23)
CO2: 23 mmol/L (ref 22–32)
Calcium: 9.3 mg/dL (ref 8.9–10.3)
Chloride: 104 mmol/L (ref 98–111)
Creatinine: 0.8 mg/dL (ref 0.44–1.00)
GFR, Est AFR Am: >60 mL/min (ref >60)
GFR, Estimated: >60 mL/min (ref >60)
Glucose, Bld: 84 mg/dL (ref 70–99)
Potassium: 4.4 mmol/L (ref 3.5–5.1)
Sodium: 137 mmol/L (ref 135–145)
Total Bilirubin: 0.6 mg/dL (ref 0.3–1.2)
Total Protein: 7.4 g/dL (ref 6.5–8.1)

## 2019-11-04 MED ORDER — DEXAMETHASONE 4 MG PO TABS
ORAL_TABLET | ORAL | Status: AC
  Filled 2019-11-04: qty 10

## 2019-11-04 MED ORDER — DEXAMETHASONE 4 MG PO TABS
40.0000 mg | ORAL_TABLET | Freq: Once | ORAL | Status: AC
  Administered 2019-11-04: 40 mg via ORAL

## 2019-11-04 MED ORDER — PROCHLORPERAZINE MALEATE 10 MG PO TABS
ORAL_TABLET | ORAL | Status: AC
  Filled 2019-11-04: qty 1

## 2019-11-04 MED ORDER — BORTEZOMIB CHEMO SQ INJECTION 3.5 MG (2.5MG/ML)
1.3000 mg/m2 | Freq: Once | INTRAMUSCULAR | Status: AC
  Administered 2019-11-04: 2.5 mg via SUBCUTANEOUS
  Filled 2019-11-04: qty 1

## 2019-11-04 MED ORDER — PROCHLORPERAZINE MALEATE 10 MG PO TABS
10.0000 mg | ORAL_TABLET | Freq: Once | ORAL | Status: AC
  Administered 2019-11-04: 14:00:00 10 mg via ORAL

## 2019-11-04 NOTE — Patient Instructions (Addendum)
[facility] Discharge Instructions for Patients Receiving Chemotherapy  Please take Imodium, as needed after loose stools, as Dr. Benay Spice recommended.  Ok to use 4-6 times per day.  If diarrhea continues with Imodium contact our office.  Please drink plenty of fluids, along with meals.    Today you received the following chemotherapy agents Velcade  To help prevent nausea and vomiting after your treatment, we encourage you to take your nausea medication as directed.    If you develop nausea and vomiting that is not controlled by your nausea medication, call the clinic.   BELOW ARE SYMPTOMS THAT SHOULD BE REPORTED IMMEDIATELY:  *FEVER GREATER THAN 100.5 F  *CHILLS WITH OR WITHOUT FEVER  NAUSEA AND VOMITING THAT IS NOT CONTROLLED WITH YOUR NAUSEA MEDICATION  *UNUSUAL SHORTNESS OF BREATH  *UNUSUAL BRUISING OR BLEEDING  TENDERNESS IN MOUTH AND THROAT WITH OR WITHOUT PRESENCE OF ULCERS  *URINARY PROBLEMS  *BOWEL PROBLEMS  UNUSUAL RASH Items with * indicate a potential emergency and should be followed up as soon as possible.  Feel free to call the clinic should you have any questions or concerns. The clinic phone number is [phone].  Please show the [facility] at check-in to the Emergency Department and triage nurse.

## 2019-11-04 NOTE — Progress Notes (Signed)
Pt reports loose stools X 3 for 8 days.  Pt with dizziness that begin during ambulation to infusion area.  VS WNL.  Pt reports drinking and eating well.  RN notified MD, okay to proceed with treatment.  Encourage use of Imodium after loose stools, and continuation hydration. Pt verbalized understanding.

## 2019-11-05 ENCOUNTER — Other Ambulatory Visit: Payer: Self-pay | Admitting: Oncology

## 2019-11-07 ENCOUNTER — Encounter: Payer: Self-pay | Admitting: Nurse Practitioner

## 2019-11-08 ENCOUNTER — Other Ambulatory Visit: Payer: Self-pay | Admitting: Nurse Practitioner

## 2019-11-08 ENCOUNTER — Encounter: Payer: Self-pay | Admitting: Nurse Practitioner

## 2019-11-08 DIAGNOSIS — C9000 Multiple myeloma not having achieved remission: Secondary | ICD-10-CM

## 2019-11-08 MED ORDER — LORAZEPAM 0.5 MG PO TABS: 0.5000 mg | ORAL_TABLET | Freq: Every evening | ORAL | 0 refills | Status: AC | PRN

## 2019-11-11 ENCOUNTER — Telehealth: Payer: Self-pay | Admitting: Oncology

## 2019-11-11 ENCOUNTER — Ambulatory Visit: Payer: BC Managed Care – PPO | Admitting: Oncology

## 2019-11-11 ENCOUNTER — Ambulatory Visit: Payer: BC Managed Care – PPO

## 2019-11-11 ENCOUNTER — Encounter: Payer: Self-pay | Admitting: Oncology

## 2019-11-11 ENCOUNTER — Other Ambulatory Visit: Payer: BC Managed Care – PPO

## 2019-11-11 NOTE — Telephone Encounter (Signed)
R/s appt per 1/22 sch message - unable to reach pt .left message with appt date and time

## 2019-11-14 ENCOUNTER — Inpatient Hospital Stay: Payer: BC Managed Care – PPO

## 2019-11-14 ENCOUNTER — Inpatient Hospital Stay (HOSPITAL_BASED_OUTPATIENT_CLINIC_OR_DEPARTMENT_OTHER): Payer: BC Managed Care – PPO | Admitting: Nurse Practitioner

## 2019-11-14 ENCOUNTER — Other Ambulatory Visit: Payer: Self-pay | Admitting: Oncology

## 2019-11-14 ENCOUNTER — Other Ambulatory Visit: Payer: Self-pay

## 2019-11-14 ENCOUNTER — Encounter: Payer: Self-pay | Admitting: Nurse Practitioner

## 2019-11-14 VITALS — BP 132/70 | HR 65 | Temp 98.5°F | Resp 18 | Ht 66.0 in | Wt 180.5 lb

## 2019-11-14 DIAGNOSIS — C9000 Multiple myeloma not having achieved remission: Secondary | ICD-10-CM

## 2019-11-14 DIAGNOSIS — Z5112 Encounter for antineoplastic immunotherapy: Secondary | ICD-10-CM | POA: Diagnosis not present

## 2019-11-14 LAB — CMP (CANCER CENTER ONLY)
ALT: 26 U/L (ref 0–44)
AST: 18 U/L (ref 15–41)
Albumin: 3.8 g/dL (ref 3.5–5.0)
Alkaline Phosphatase: 67 U/L (ref 38–126)
Anion gap: 7 (ref 5–15)
BUN: 13 mg/dL (ref 8–23)
CO2: 24 mmol/L (ref 22–32)
Calcium: 8.5 mg/dL — ABNORMAL LOW (ref 8.9–10.3)
Chloride: 109 mmol/L (ref 98–111)
Creatinine: 0.73 mg/dL (ref 0.44–1.00)
GFR, Est AFR Am: >60 mL/min (ref >60)
GFR, Estimated: >60 mL/min (ref >60)
Glucose, Bld: 102 mg/dL — ABNORMAL HIGH (ref 70–99)
Potassium: 4.3 mmol/L (ref 3.5–5.1)
Sodium: 140 mmol/L (ref 135–145)
Total Bilirubin: 0.6 mg/dL (ref 0.3–1.2)
Total Protein: 6.8 g/dL (ref 6.5–8.1)

## 2019-11-14 LAB — CBC WITH DIFFERENTIAL (CANCER CENTER ONLY)
Abs Immature Granulocytes: 0.02 10*3/uL (ref 0.00–0.07)
Basophils Absolute: 0.1 10*3/uL (ref 0.0–0.1)
Basophils Relative: 1 %
Eosinophils Absolute: 0.1 10*3/uL (ref 0.0–0.5)
Eosinophils Relative: 2 %
HCT: 35.5 % — ABNORMAL LOW (ref 36.0–46.0)
Hemoglobin: 11.8 g/dL — ABNORMAL LOW (ref 12.0–15.0)
Immature Granulocytes: 0 %
Lymphocytes Relative: 51 %
Lymphs Abs: 2.8 10*3/uL (ref 0.7–4.0)
MCH: 34.7 pg — ABNORMAL HIGH (ref 26.0–34.0)
MCHC: 33.2 g/dL (ref 30.0–36.0)
MCV: 104.4 fL — ABNORMAL HIGH (ref 80.0–100.0)
Monocytes Absolute: 0.8 10*3/uL (ref 0.1–1.0)
Monocytes Relative: 15 %
Neutro Abs: 1.7 10*3/uL (ref 1.7–7.7)
Neutrophils Relative %: 31 %
Platelet Count: 149 10*3/uL — ABNORMAL LOW (ref 150–400)
RBC: 3.4 MIL/uL — ABNORMAL LOW (ref 3.87–5.11)
RDW: 12.9 % (ref 11.5–15.5)
WBC Count: 5.5 10*3/uL (ref 4.0–10.5)
nRBC: 0 % (ref 0.0–0.2)

## 2019-11-14 NOTE — Progress Notes (Signed)
  [facility] OFFICE PROGRESS NOTE   Diagnosis: Multiple myeloma  INTERVAL HISTORY:   Katrina Cunningham returns for follow-up.  She began cycle 3 RVD 10/27/2019.  She was scheduled for cycle 3 day 15 Velcade on 11/11/2019 but canceled due to diarrhea.  She denies nausea/vomiting.  No mouth sores.  She has periodic diarrhea.  No fever.  Cough is better.  She has periodic dyspnea on exertion.  Back pain continues to be improved.  She states that she feels "crummy" after taking steroids.  Objective:  Vital signs in last 24 hours:  Blood pressure 132/70, pulse 65, temperature 98.5 F (36.9 C), temperature source Temporal, resp. rate 18, height 5' 6" (1.676 m), weight 180 lb 8 oz (81.9 kg), SpO2 99 %.    HEENT: No thrush or ulcers. GI: Abdomen soft and nontender.  No hepatosplenomegaly. Vascular: No leg edema. Neuro: Alert and oriented. Skin: No rash.   Lab Results:  Lab Results  Component Value Date   WBC 5.5 11/14/2019   HGB 11.8 (L) 11/14/2019   HCT 35.5 (L) 11/14/2019   MCV 104.4 (H) 11/14/2019   PLT 149 (L) 11/14/2019   NEUTROABS 1.7 11/14/2019    Imaging:  No results found.  Medications: I have reviewed the patient's current medications.  Assessment/Plan: 1. Multiple myeloma-IgG kappa ? 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 MRI lumbar spine 07/28/2019-acute L1 compression fracture without central canal stenosis, enhancing lesions at L3 and L5 ? Bone survey 08/23/2019-prominent lucency over the temporal region on the lateral skull view. No other focal abnormality identified. ? 08/23/2019 serum kappa light chains 45, serum M spike 1.6, IgG 2154, serum immunofixation-IgG kappa ? Zometa every 3 months beginning 08/26/2019  ? skull x-ray 08/29/2019-temporal calvarial lucency unchanged since a head CT May 2013 felt to be a normal variant. ? 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 Cytogenetics-40 6XX ? Cycle 1 RVD 09/02/2019 (anticipate she will begin Revlimid 09/07/2019) ? Cycle 2 RVD 09/29/2019 (prematurely discontinued Revlimid 10/06/2019 due to a diagnosis of Covid; she did not receive Velcade with this cycle) ? Cycle 3 RVD 10/27/2019 (she declined day 15 Velcade)   2. Acute L1 pathological pressure fracture, status post a kyphoplasty 08/10/2019  3.Pain secondary #2  4.Tobacco and alcohol use  5.History of multiple back surgeries  6.Anemia secondary to #1  7.Anxiety/depression  8.  Covid 10/05/2020  Disposition: Katrina Cunningham appears stable.  She began cycle 3 RVD 10/27/2019.  She declines day 15 Velcade today.  Her care has been transitioned to [facility] due to insurance issues.  She is scheduled for an appointment on 11/21/2019.  We reviewed the CBC from today.  Blood counts are stable.  We are available to see her in the future as needed.    Ned Card ANP/GNP-BC   11/14/2019  3:20 PM

## 2019-11-18 ENCOUNTER — Encounter: Payer: Self-pay | Admitting: *Deleted

## 2019-11-18 NOTE — Progress Notes (Signed)
At request of Tobias Alexander, PharmD with Dr. Tiana Loft called [employer] to cancel the recent authorization so they can re-order med with new dosing. This was accomplished as requested.

## 2019-12-06 ENCOUNTER — Other Ambulatory Visit: Payer: Self-pay | Admitting: Nurse Practitioner

## 2019-12-06 DIAGNOSIS — C9000 Multiple myeloma not having achieved remission: Secondary | ICD-10-CM

## 2019-12-07 ENCOUNTER — Other Ambulatory Visit: Payer: Self-pay | Admitting: Nurse Practitioner

## 2019-12-07 DIAGNOSIS — C9000 Multiple myeloma not having achieved remission: Secondary | ICD-10-CM

## 2019-12-09 ENCOUNTER — Other Ambulatory Visit: Payer: Self-pay | Admitting: Nurse Practitioner

## 2019-12-09 DIAGNOSIS — C9000 Multiple myeloma not having achieved remission: Secondary | ICD-10-CM

## 2020-01-09 ENCOUNTER — Other Ambulatory Visit: Payer: Self-pay | Admitting: Nurse Practitioner

## 2020-01-09 DIAGNOSIS — C9000 Multiple myeloma not having achieved remission: Secondary | ICD-10-CM

## 2020-02-09 ENCOUNTER — Ambulatory Visit: Payer: BC Managed Care – PPO

## 2020-02-23 ENCOUNTER — Encounter: Payer: Self-pay | Admitting: *Deleted

## 2020-02-23 NOTE — Progress Notes (Signed)
Faxed bone marrow biopsy report w/cytogenetics and FISH analysis to [facility] att: Wells Guiles. Fax [phone]. They are proceeding with transplant now.

## 2020-10-18 ENCOUNTER — Other Ambulatory Visit: Payer: Self-pay

## 2020-10-18 ENCOUNTER — Other Ambulatory Visit: Payer: Self-pay | Admitting: Oncology

## 2020-10-18 NOTE — Progress Notes (Signed)
Refill request for compazine.

## 2020-10-25 IMAGING — RF DG C-ARM 1-60 MIN
2 series · 2 of 2 positions shown · IV contrast (agent unspecified)
Comparison: May 26, 2019.

CLINICAL DATA: Status post L1 kyphoplasty.

EXAM:
DG C-ARM 1-60 MIN; LUMBAR SPINE - 2-3 VIEW
CONTRAST:  None.
FLUOROSCOPY TIME:  Fluoroscopy Time:  1 minutes 50 seconds.
Number of Acquired Spot Images: 2.

[Series 1: run · 1 of 1 slices shown (1 of 2)]
[im 1/1]
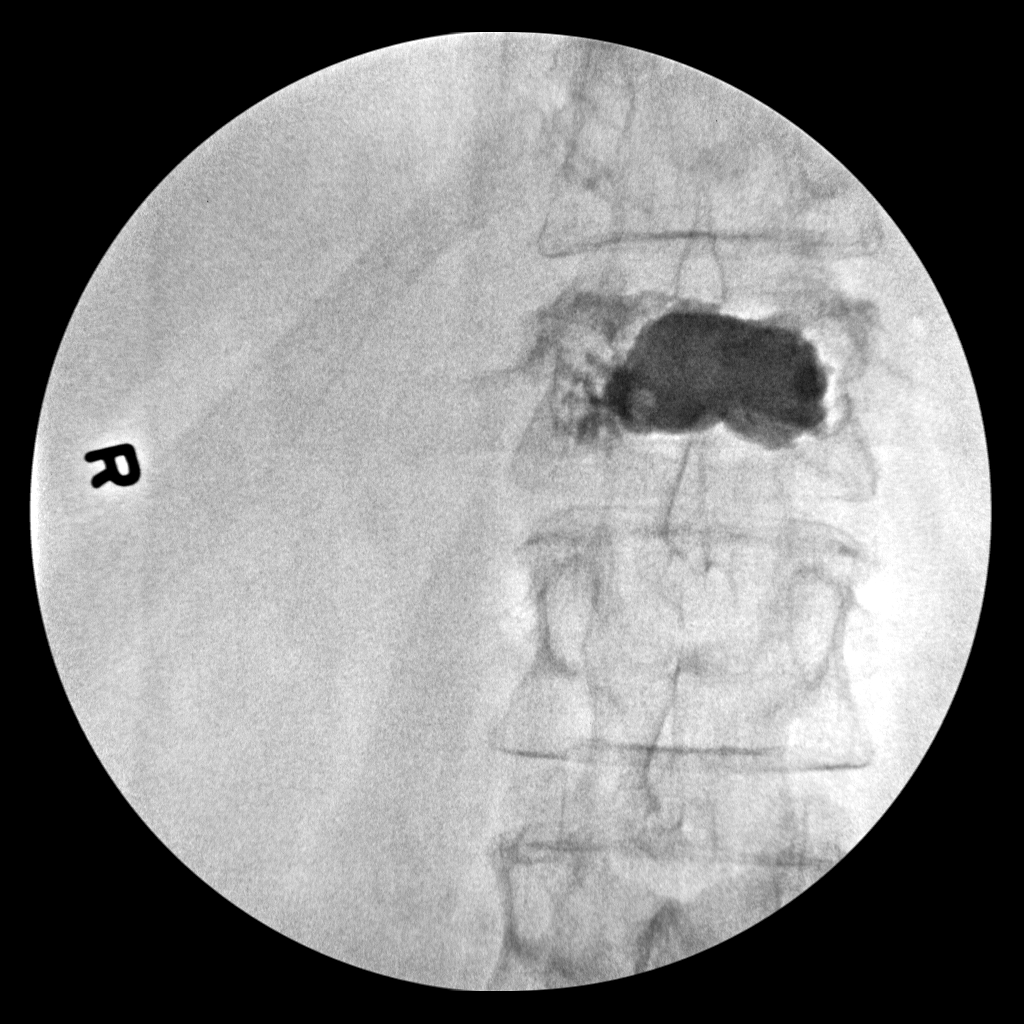

[Series 1: run · 1 of 1 slices shown (2 of 2)]
[im 1/1]
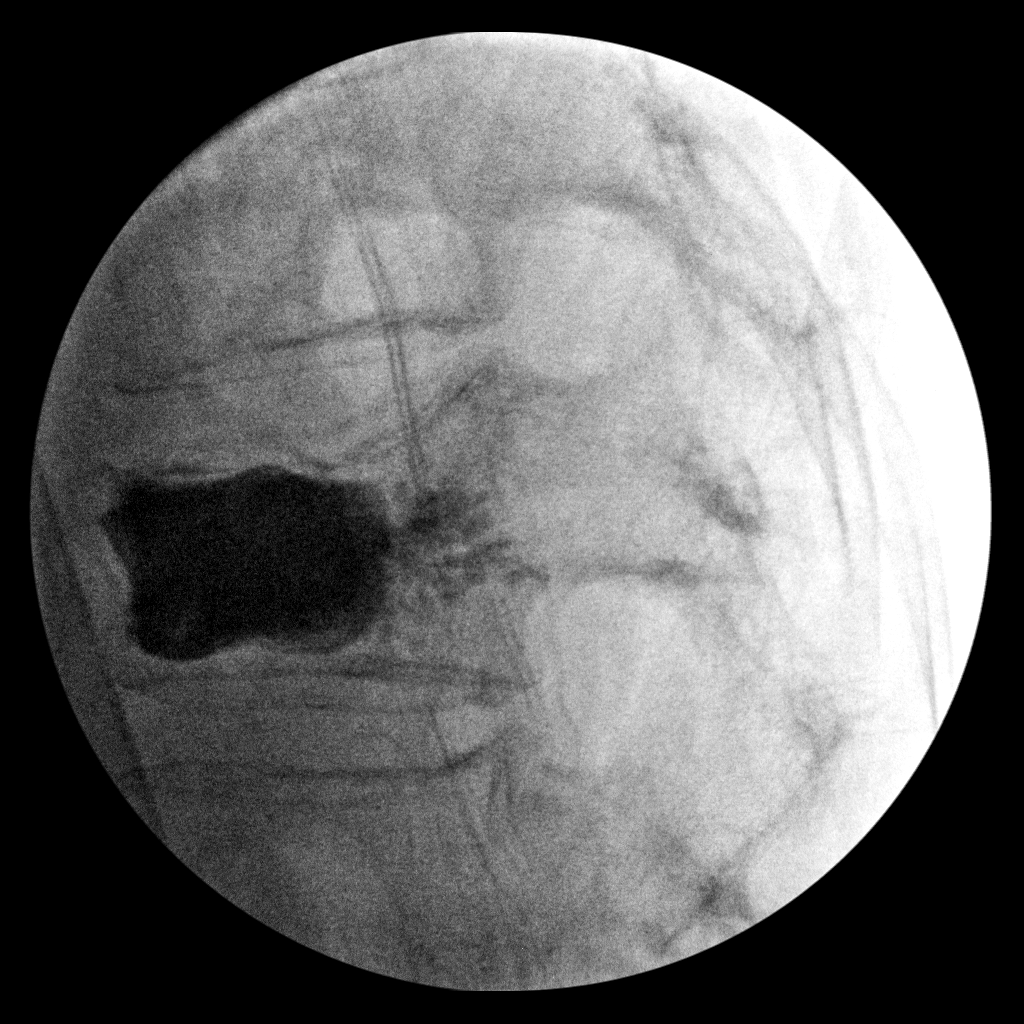

[2 of 2 positions shown; findings below may reference images not displayed]

FINDINGS: Two intraoperative fluoroscopic images were obtained of the lumbar
spine. The patient is status post kyphoplasty of L1 vertebral body.
IMPRESSION: Fluoroscopic guidance provided during L1 kyphoplasty.

## 2020-10-25 IMAGING — RF DG LUMBAR SPINE 2-3V
1 series · 1 of 1 positions shown · IV contrast (agent unspecified)
Comparison: May 26, 2019.

CLINICAL DATA: Status post L1 kyphoplasty.

EXAM:
DG C-ARM 1-60 MIN; LUMBAR SPINE - 2-3 VIEW
CONTRAST:  None.
FLUOROSCOPY TIME:  Fluoroscopy Time:  1 minutes 50 seconds.
Number of Acquired Spot Images: 2.

[Series 1: run · 1 of 1 slices shown]
[im 1/1]
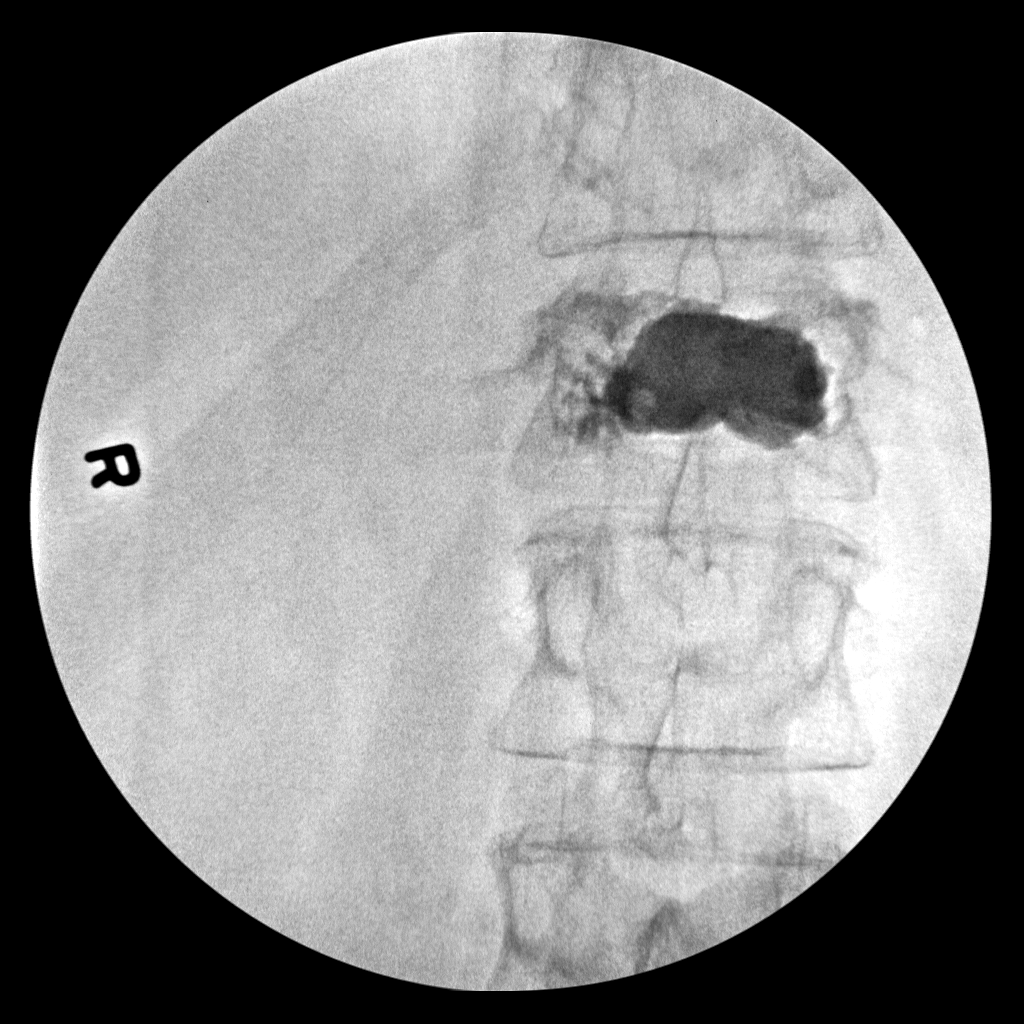

[1 of 1 positions shown; findings below may reference images not displayed]

FINDINGS: Two intraoperative fluoroscopic images were obtained of the lumbar
spine. The patient is status post kyphoplasty of L1 vertebral body.
IMPRESSION: Fluoroscopic guidance provided during L1 kyphoplasty.

## 2020-11-13 IMAGING — DX DG SKULL COMPLETE 4+V
4 series · 4 of 4 positions shown · non-contrast
Comparison: 08/23/2019.  Head CT 02/21/2012

CLINICAL DATA: Multiple myeloma. Follow-up.

EXAM:
SKULL - COMPLETE 4 + VIEW

[skull [place]]
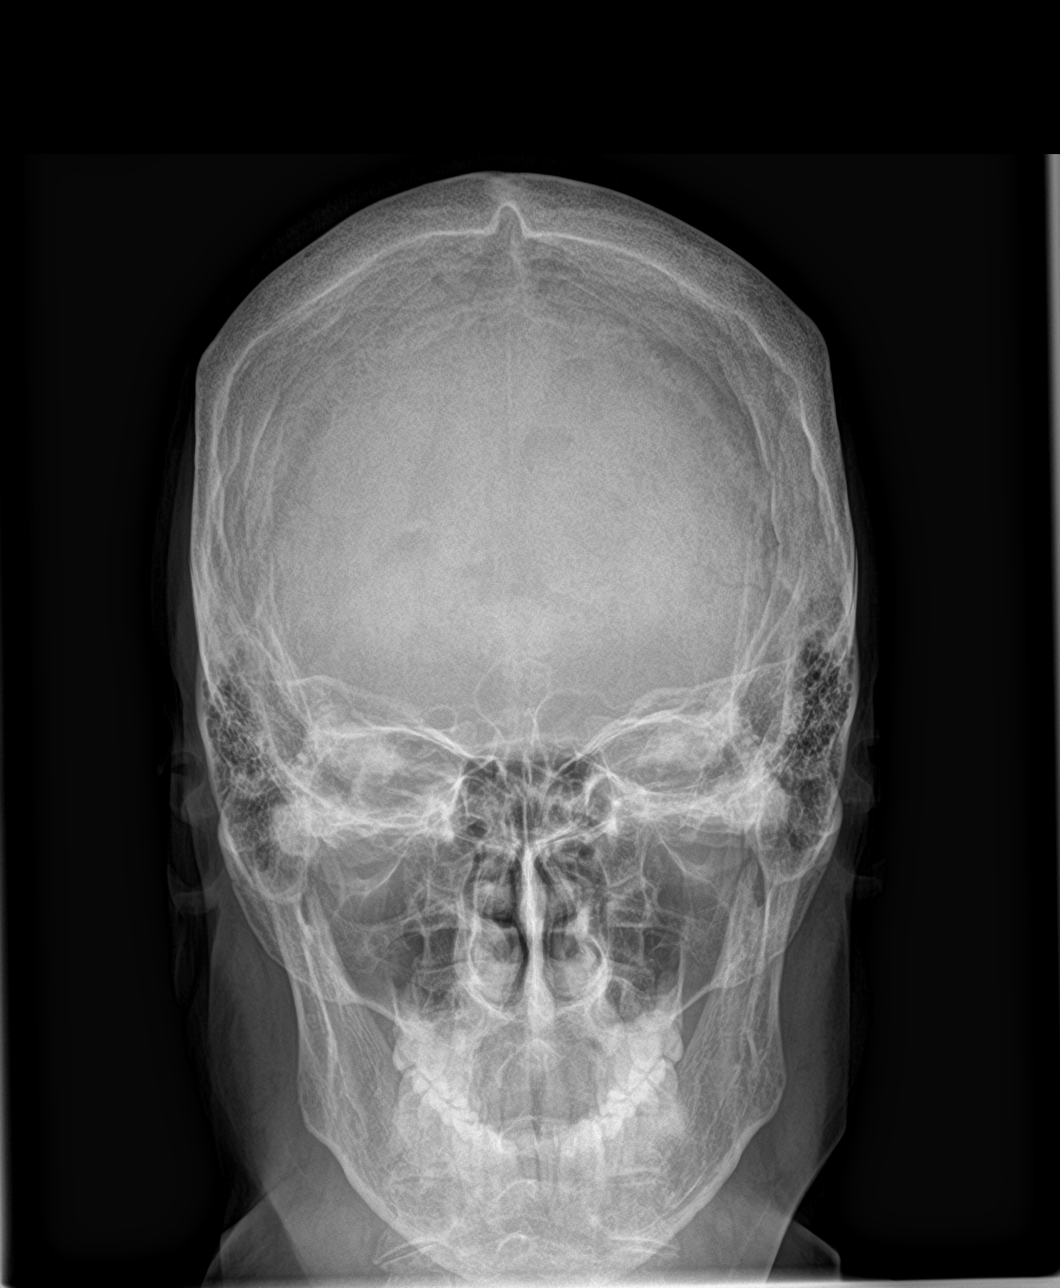

[skull [place]]
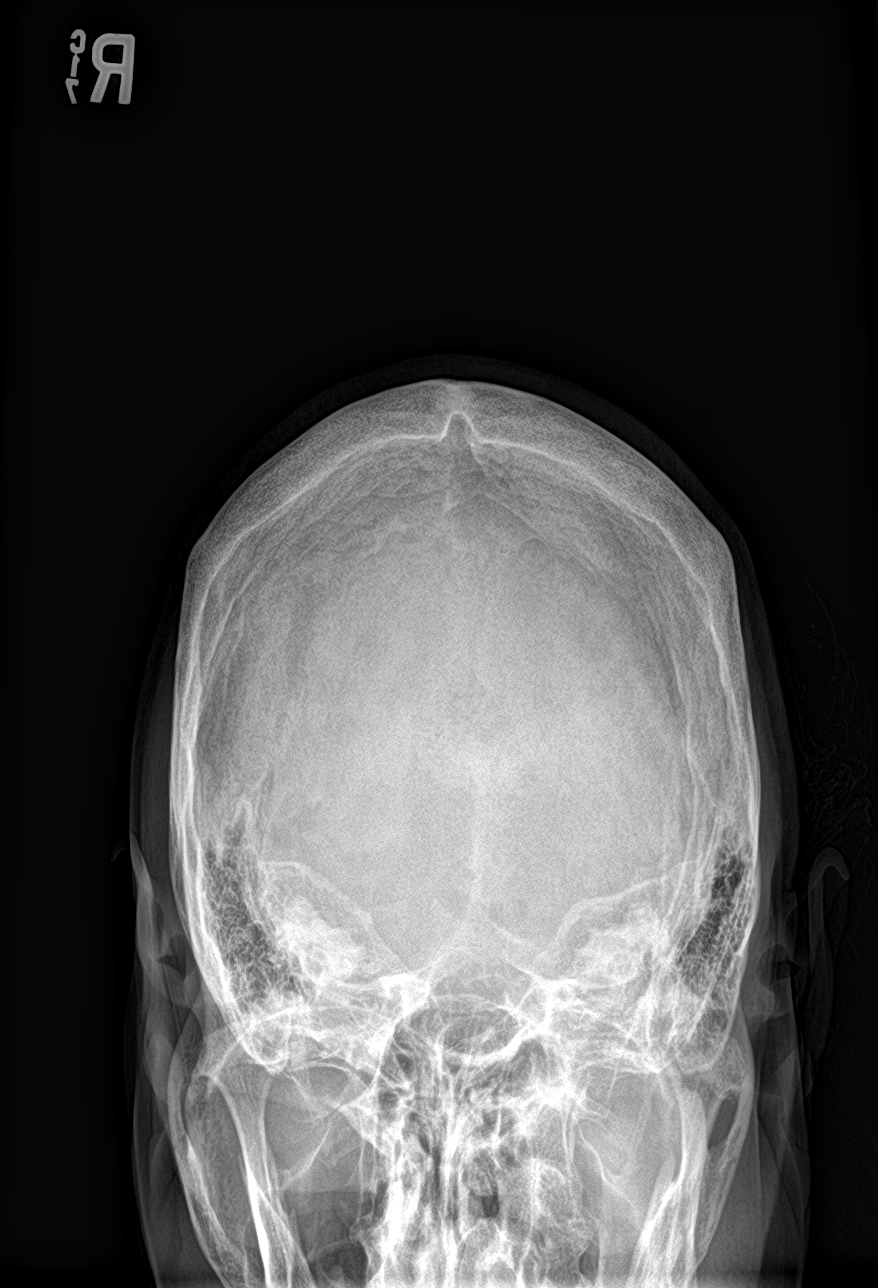

[skull lat (1 of 2)]
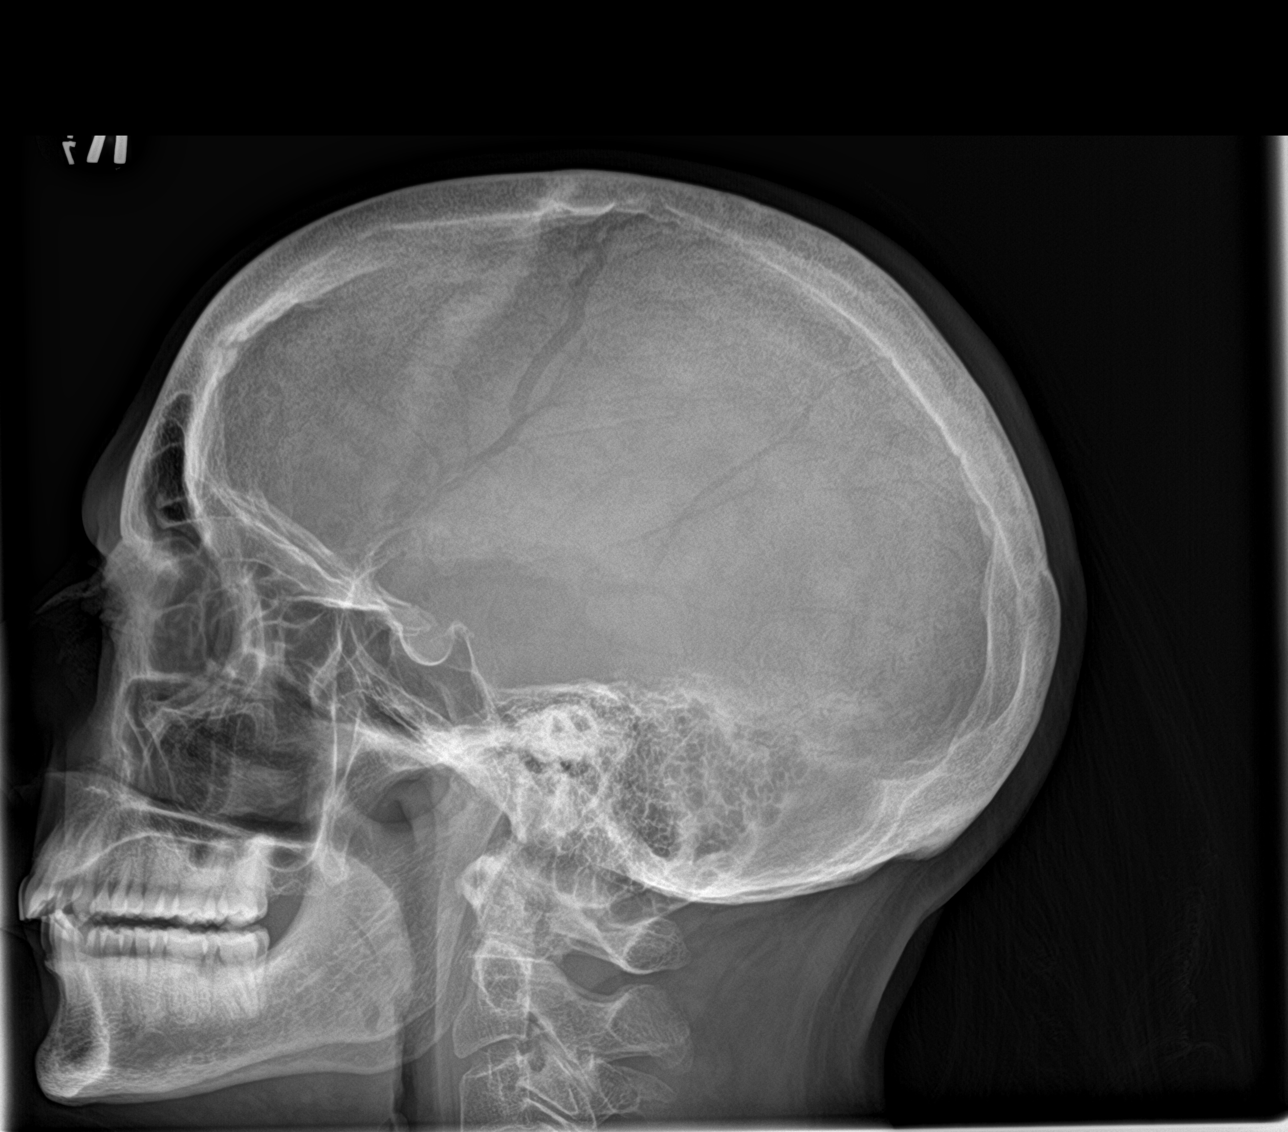

[skull lat (2 of 2)]
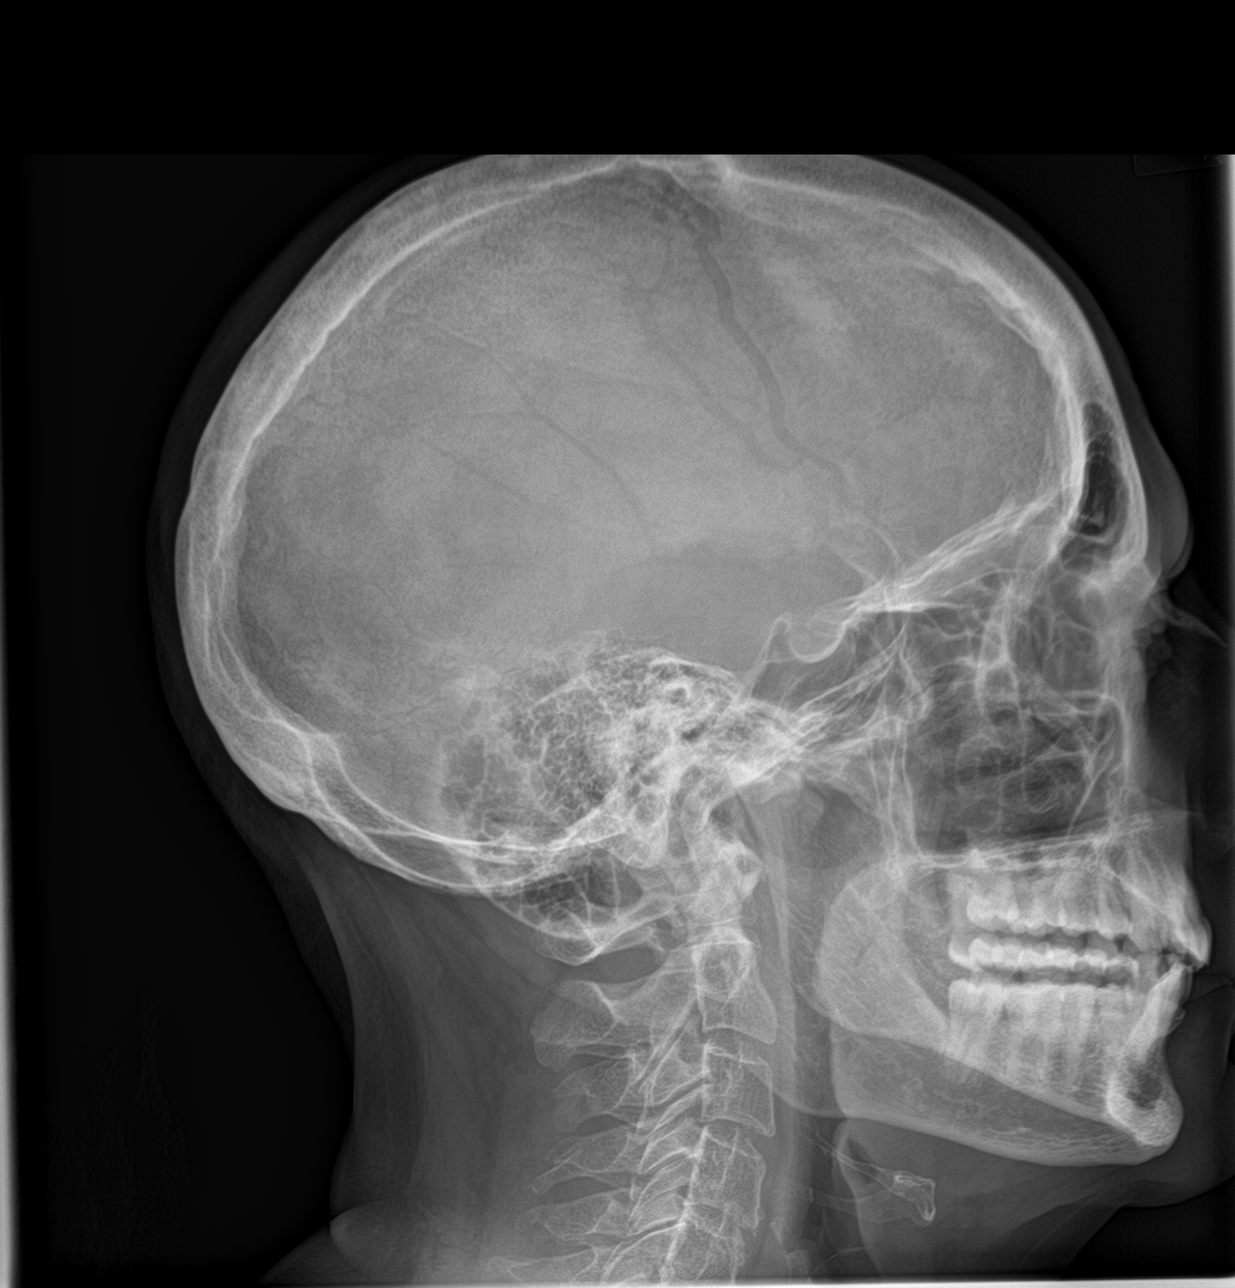

[4 of 4 positions shown; findings below may reference images not displayed]

FINDINGS: No change since the previous study. Relative lucency in the temporal
calvarial region can be seen as a stable finding when compared to a
scout radiograph from a head CT dated 02/21/2012. This would be an
unusual appearance for myeloma.
IMPRESSION: Temporal calvarial lucency, unchanged since a head CT scout image
from Saturday February, 2012. The appearance would be unusual for myeloma. This
is felt to be a normal variant, especially in the absence of any
other skeletal findings.

## 2023-09-09 ENCOUNTER — Encounter: Payer: Self-pay | Admitting: Oncology

## 2023-09-09 NOTE — Telephone Encounter (Signed)
Telephone call
